# Patient Record
Sex: Female | Born: 1960 | Race: Black or African American | Hispanic: No | Smoking: Current some day smoker
Health system: Southern US, Community
[De-identification: ages and names within clinical notes are randomized; demographics above are authoritative.]

## PROBLEM LIST (undated history)

## (undated) DIAGNOSIS — E785 Hyperlipidemia, unspecified: Secondary | ICD-10-CM

## (undated) DIAGNOSIS — M1990 Unspecified osteoarthritis, unspecified site: Secondary | ICD-10-CM

## (undated) DIAGNOSIS — I10 Essential (primary) hypertension: Secondary | ICD-10-CM

## (undated) DIAGNOSIS — E119 Type 2 diabetes mellitus without complications: Secondary | ICD-10-CM

## (undated) HISTORY — DX: Hyperlipidemia, unspecified: E78.5

## (undated) HISTORY — DX: Unspecified osteoarthritis, unspecified site: M19.90

---

## 1996-06-23 HISTORY — PX: BACK SURGERY: SHX140

## 2000-05-18 ENCOUNTER — Emergency Department (HOSPITAL_COMMUNITY): Admission: EM | Admit: 2000-05-18 | Discharge: 2000-05-18 | Payer: Self-pay | Admitting: Emergency Medicine

## 2016-07-29 DIAGNOSIS — Z6835 Body mass index (BMI) 35.0-35.9, adult: Secondary | ICD-10-CM | POA: Diagnosis not present

## 2016-07-29 DIAGNOSIS — B029 Zoster without complications: Secondary | ICD-10-CM | POA: Diagnosis not present

## 2017-06-10 ENCOUNTER — Other Ambulatory Visit: Payer: Self-pay

## 2017-06-10 ENCOUNTER — Encounter (HOSPITAL_COMMUNITY): Payer: Self-pay | Admitting: Emergency Medicine

## 2017-06-10 ENCOUNTER — Emergency Department (HOSPITAL_COMMUNITY)
Admission: EM | Admit: 2017-06-10 | Discharge: 2017-06-10 | Disposition: A | Discharge status: Home / Self Care | Payer: 59 | Attending: Emergency Medicine | Admitting: Emergency Medicine

## 2017-06-10 DIAGNOSIS — I10 Essential (primary) hypertension: Secondary | ICD-10-CM | POA: Diagnosis not present

## 2017-06-10 DIAGNOSIS — R51 Headache: Secondary | ICD-10-CM | POA: Diagnosis present

## 2017-06-10 DIAGNOSIS — F17200 Nicotine dependence, unspecified, uncomplicated: Secondary | ICD-10-CM | POA: Diagnosis not present

## 2017-06-10 DIAGNOSIS — E119 Type 2 diabetes mellitus without complications: Secondary | ICD-10-CM | POA: Insufficient documentation

## 2017-06-10 DIAGNOSIS — R519 Headache, unspecified: Secondary | ICD-10-CM

## 2017-06-10 HISTORY — DX: Essential (primary) hypertension: I10

## 2017-06-10 HISTORY — DX: Type 2 diabetes mellitus without complications: E11.9

## 2017-06-10 MED ORDER — HYDROCHLOROTHIAZIDE 25 MG PO TABS: 25.0000 mg | ORAL_TABLET | Freq: Every day | ORAL | 0 refills | Status: DC

## 2017-06-10 MED ORDER — ATENOLOL 50 MG PO TABS: 50.0000 mg | ORAL_TABLET | Freq: Every day | ORAL | 0 refills | Status: DC

## 2017-06-10 NOTE — ED Triage Notes (Signed)
Pt reports being out of BP meds at home, c/o frequent headaches.  Pt reports [facility] 190[facility]120 at [facility]. Pt denies weakness, dizziness, changes to vision.  AOx4, needs meds refilled.

## 2017-06-10 NOTE — Discharge Instructions (Signed)
Please read attached information. If you experience any new or worsening signs or symptoms please return to the emergency room for evaluation. Please follow-up with your primary care provider or specialist as discussed. Please use medication prescribed only as directed and discontinue taking if you have any concerning signs or symptoms.   °

## 2017-06-10 NOTE — ED Provider Notes (Signed)
[place] EMERGENCY DEPARTMENT Provider Note   CSN: [id number] Arrival date & time: 06/10/17  1059     History   Chief Complaint Chief Complaint  Patient presents with  . Headache    HPI Tara Jarvis is a 56 y.o. female.  HPI    56 year old female presents today with complaints of headache.  Patient reports minor anterior based headache, denies any neurological deficits.  Patient notes these have been more frequent over the last month.  She reports that she has very minimal discomfort at the time of my evaluation.  Patient also reports that she checked her blood pressure yesterday and had an elevation to 190/120.  She reports she has been out of her blood pressure medication over the last month and took her brothers.  She notes she takes atenolol 50 mg twice daily, HCTZ once daily.  Patient denies any neurological deficits, chest pain shortness of breath abdominal pain, or any signs of endorgan damage.  She recently moved to the area and has not establish care with the primary care provider   Past Medical History:  Diagnosis Date  . Diabetes mellitus without complication ([place])   . Hypertension     There are no active problems to display for this patient.   Past Surgical History:  Procedure Laterality Date  . BACK SURGERY      OB History    No data available       Home Medications    Prior to Admission medications   Medication Sig Start Date End Date Taking? Authorizing Provider  latanoprost (XALATAN) 0.005 % ophthalmic solution Place 1 drop into both eyes at bedtime. 05/21/17  Yes [provider]  naproxen sodium (ALEVE) 220 MG tablet Take 220 mg by mouth 2 (two) times daily as needed (pain).   Yes [provider]  atenolol (TENORMIN) 50 MG tablet Take 1 tablet (50 mg total) by mouth daily. 06/10/17   Leniyah Martell, Dellis Filbert, PA-C  hydrochlorothiazide (HYDRODIURIL) 25 MG tablet Take 1 tablet (25 mg total) by mouth daily. 06/10/17    Okey Regal, PA-C    Family History No family history on file.  Social History Social History   Tobacco Use  . Smoking status: Current Every Day Smoker    Packs/day: 0.20  . Smokeless tobacco: Never Used  Substance Use Topics  . Alcohol use: Yes    Alcohol/week: 2.4 oz    Types: 4 Shots of liquor per week  . Drug use: No     Allergies   Patient has no known allergies.   Review of Systems Review of Systems  All other systems reviewed and are negative.    Physical Exam Updated Vital Signs BP (!) 160/90   Pulse 76   Temp 98.7 F (37.1 C) (Oral)   Resp 18   SpO2 100%   Physical Exam  Constitutional: She is oriented to person, place, and time. She appears well-developed and well-nourished.  HENT:  Head: Normocephalic and atraumatic.  Eyes: Conjunctivae are normal. Pupils are equal, round, and reactive to light. Right eye exhibits no discharge. Left eye exhibits no discharge. No scleral icterus.  Neck: Normal range of motion. No JVD present. No tracheal deviation present.  Cardiovascular: Normal rate, regular rhythm and normal heart sounds.  Pulmonary/Chest: Effort normal. No stridor.  Neurological: She is alert and oriented to person, place, and time. No cranial nerve deficit or sensory deficit. She exhibits normal muscle tone. Coordination normal.  Psychiatric: She has a normal mood  and affect. Her behavior is normal. Judgment and thought content normal.  Nursing note and vitals reviewed.    ED Treatments / Results  Labs (all labs ordered are listed, but only abnormal results are displayed) Labs Reviewed - No data to display  EKG  EKG Interpretation None       Radiology No results found.  Procedures Procedures (including critical care time)  Medications Ordered in ED Medications - No data to display   Initial Impression / Assessment and Plan / ED Course  I have reviewed the triage vital signs and the nursing notes.  Pertinent labs &  imaging results that were available during my care of the patient were reviewed by me and considered in my medical decision making (see chart for details).    Final Clinical Impressions(s) / ED Diagnoses   Final diagnoses:  Hypertension, unspecified type  Nonintractable headache, unspecified chronicity pattern, unspecified headache type    Labs:   Imaging:  Consults:  Therapeutics:  Discharge Meds: Atenolol, HCTZ  Assessment/Plan: 56 year old female presents today with asymptomatic hypertension.  Patient is having a mild headache, low suspicion this is truly caused by her headaches.  No signs of endorgan damage here today.  Patient will be given prescription for her hypertensive medication encouraged follow-up with primary care return immediately if any new or worsening signs or symptoms present.  Patient verbalized understanding and agreement to today's plan had no further questions or concerns   ED Discharge Orders        Ordered    atenolol (TENORMIN) 50 MG tablet  Daily     06/10/17 1331    hydrochlorothiazide (HYDRODIURIL) 25 MG tablet  Daily     06/10/17 1331       Okey Regal, PA-C 06/10/17 1851    Daleen Bo, MD 06/11/17 [phone]

## 2017-08-25 NOTE — Progress Notes (Signed)
Subjective   Patient ID: Tara Jarvis    DOB: 02/12/1961, 57 y.o. female   MRN: [id number]  CC: "Establish care"  HPI: Tara Jarvis is a 57 y.o. female who presents to clinic today for the following:  Hypertension: Patient was diagnosed with high blood pressure several years ago and started on HCTZ and atenolol.  She has been off her medication for the last month and a half.  She is a current everyday smoker.  She was previously tried on lisinopril but had an anaphylactic reaction.  Diabetes: Patient denies history of insulin use.  She has been on metformin for the last 2 years with good adherence and no side effects.  She does not check her sugar levels.  She is not sure what her last A1c was.  She was previously seen by an ophthalmologist annually.  Smoking: Current everyday smoker with 1/4 packs/day.  Has been smoking for 6 years without prior attempts to quit.  She is not currently interested in cessation today.  She denies cough, hemoptysis, explained weight loss, chest pain, shortness of breath.  Alcohol use: Currently drinks 2-4 next drinks on Friday or Saturday during the week.  She does not endorse intoxication.  She has considered cutting back and does not feel angry of guilty about her drinking.  Patient does feel sometimes she needs to have a drink when she gets angry.  ROS: see HPI for pertinent.  [place]: NIDT2DM, HTN, tobacco use disorder. Smoking status reviewed. Medications reviewed.  Objective   BP (!) 152/90   Pulse 90   Temp 98.1 F (36.7 C) (Oral)   Ht 5' 7"  (1.702 m)   Wt 216 lb (98 kg)   SpO2 99%   BMI 33.83 kg/m  Vitals and nursing note reviewed.  General: well nourished, well developed, NAD with non-toxic appearance HEENT: normocephalic, atraumatic, moist mucous membranes Neck: supple, non-tender without lymphadenopathy Cardiovascular: regular rate and rhythm without murmurs, rubs, or gallops Lungs: clear to auscultation bilaterally with normal work  of breathing Abdomen: soft, non-tender, non-distended, normoactive bowel sounds Skin: warm, dry, no rashes or lesions, cap refill < 2 seconds Extremities: warm and well perfused, normal tone, no edema  Assessment & Plan   Tobacco use disorder Chronic.  Current everyday user.  Smoking 1/4 packs/day with 6-year history.  No prior attempts to stop.  Not interested at present. - Discussed importance of smoking cessation  Primary hypertension Chronic.  Poorly controlled due to expiration of antihypertensive.  Currently on previously on HCTZ and atenolol.  Does have anaphylaxis to ACE inhibitors. - Given refill for HCTZ 25 mg daily, will plan to restart atenolol if BP continues to be elevated at follow-up - Advised patient to check ambulatory BP at local pharmacy and/or grocery store - Discussed smoking cessation  Controlled diabetes mellitus with complication, without long-term current use of insulin (HCC) Chronic.  Currently controlled.  A1c 7.0.  No history of insulin use. - Ambulatory referral to ophthalmology for annual screening - CBC with differential, CMET, TSH, lipid panel, microalbumin/creatinine urine ratio - Switched to metformin extended release 500 mg daily  Orders Placed This Encounter  Procedures  . CBC with Differential/Platelet  . CMP14+EGFR  . TSH  . Lipid panel  . Microalbumin / creatinine urine ratio  . Ambulatory referral to Ophthalmology    Referral Priority:   Routine    Referral Type:   Consultation    Referral Reason:   Specialty Services Required    Requested Specialty:  Ophthalmology    Number of Visits Requested:   1  . HgB A1c   Meds ordered this encounter  Medications  . metFORMIN (GLUCOPHAGE XR) 500 MG 24 hr tablet    Sig: Take 1 tablet (500 mg total) by mouth daily with breakfast.    Dispense:  90 tablet    Refill:  3  . DISCONTD: hydrochlorothiazide (HYDRODIURIL) 25 MG tablet    Sig: Take 1 tablet (25 mg total) by mouth daily.    Dispense:   90 tablet    Refill:  3  . hydrochlorothiazide (HYDRODIURIL) 25 MG tablet    Sig: Take 1 tablet (25 mg total) by mouth daily.    Dispense:  90 tablet    Refill:  [place], [place], PGY-2 08/28/2017, 2:19 PM

## 2017-08-28 ENCOUNTER — Encounter: Payer: Self-pay | Admitting: Family Medicine

## 2017-08-28 ENCOUNTER — Other Ambulatory Visit: Payer: Self-pay

## 2017-08-28 ENCOUNTER — Ambulatory Visit (INDEPENDENT_AMBULATORY_CARE_PROVIDER_SITE_OTHER): Payer: Medicare Other | Admitting: Family Medicine

## 2017-08-28 VITALS — BP 152/90 | HR 90 | Temp 98.1°F | Ht 67.0 in | Wt 216.0 lb

## 2017-08-28 DIAGNOSIS — F17200 Nicotine dependence, unspecified, uncomplicated: Secondary | ICD-10-CM | POA: Diagnosis not present

## 2017-08-28 DIAGNOSIS — E118 Type 2 diabetes mellitus with unspecified complications: Secondary | ICD-10-CM

## 2017-08-28 DIAGNOSIS — E1165 Type 2 diabetes mellitus with hyperglycemia: Secondary | ICD-10-CM | POA: Insufficient documentation

## 2017-08-28 DIAGNOSIS — I10 Essential (primary) hypertension: Secondary | ICD-10-CM

## 2017-08-28 DIAGNOSIS — R7309 Other abnormal glucose: Secondary | ICD-10-CM

## 2017-08-28 DIAGNOSIS — Z0000 Encounter for general adult medical examination without abnormal findings: Secondary | ICD-10-CM

## 2017-08-28 DIAGNOSIS — E119 Type 2 diabetes mellitus without complications: Secondary | ICD-10-CM | POA: Insufficient documentation

## 2017-08-28 DIAGNOSIS — IMO0002 Reserved for concepts with insufficient information to code with codable children: Secondary | ICD-10-CM | POA: Insufficient documentation

## 2017-08-28 LAB — POCT GLYCOSYLATED HEMOGLOBIN (HGB A1C): HEMOGLOBIN A1C: 7

## 2017-08-28 MED ORDER — HYDROCHLOROTHIAZIDE 25 MG PO TABS: 25.0000 mg | ORAL_TABLET | Freq: Every day | ORAL | 3 refills | Status: DC

## 2017-08-28 MED ORDER — METFORMIN HCL ER 500 MG PO TB24: 500.0000 mg | ORAL_TABLET | Freq: Every day | ORAL | 3 refills | Status: DC

## 2017-08-28 NOTE — Assessment & Plan Note (Addendum)
Chronic.  Poorly controlled due to expiration of antihypertensive.  Currently on previously on HCTZ and atenolol.  Does have anaphylaxis to ACE inhibitors. - Given refill for HCTZ 25 mg daily, will plan to restart atenolol if BP continues to be elevated at follow-up - Advised patient to check ambulatory BP at local pharmacy and/or grocery store - Discussed smoking cessation

## 2017-08-28 NOTE — Patient Instructions (Signed)
Thank you for coming in to see [place] today. Please see below to review our plan for today's visit.  1.  I will call you if your labs are abnormal, otherwise expect to receive the results in the mail. 2.  I sent in a refill of your HCTZ for your blood pressure and a refill of metformin extended release which you will take daily. 3.  We will see you again in 1 month.  Please call the clinic at [phone] if your symptoms worsen or you have any concerns. It was our pleasure to serve you.  Harriet Butte, [place], PGY-2

## 2017-08-28 NOTE — Assessment & Plan Note (Addendum)
Chronic.  Currently controlled.  A1c 7.0.  No history of insulin use. - Ambulatory referral to ophthalmology for annual screening - CBC with differential, CMET, TSH, lipid panel, microalbumin/creatinine urine ratio - Switched to metformin extended release 500 mg daily

## 2017-08-28 NOTE — Assessment & Plan Note (Addendum)
Chronic.  Current everyday user.  Smoking 1/4 packs/day with 6-year history.  No prior attempts to stop.  Not interested at present. - Discussed importance of smoking cessation

## 2017-08-29 LAB — CBC WITH DIFFERENTIAL/PLATELET
Basophils Absolute: 0 10*3/uL (ref 0.0–0.2)
Basos: 0 %
EOS (ABSOLUTE): 0.1 10*3/uL (ref 0.0–0.4)
EOS: 1 %
Hematocrit: 43 % (ref 34.0–46.6)
Hemoglobin: 14.3 g/dL (ref 11.1–15.9)
Immature Grans (Abs): 0 10*3/uL (ref 0.0–0.1)
Immature Granulocytes: 0 %
LYMPHS ABS: 1.8 10*3/uL (ref 0.7–3.1)
Lymphs: 39 %
MCH: 29.9 pg (ref 26.6–33.0)
MCHC: 33.3 g/dL (ref 31.5–35.7)
MCV: 90 fL (ref 79–97)
Monocytes Absolute: 0.3 10*3/uL (ref 0.1–0.9)
Monocytes: 7 %
NEUTROS ABS: 2.5 10*3/uL (ref 1.4–7.0)
Neutrophils: 53 %
Platelets: 209 10*3/uL (ref 150–379)
RBC: 4.79 x10E6/uL (ref 3.77–5.28)
RDW: 16.3 % — ABNORMAL HIGH (ref 12.3–15.4)
WBC: 4.7 10*3/uL (ref 3.4–10.8)

## 2017-08-29 LAB — MICROALBUMIN / CREATININE URINE RATIO
CREATININE, UR: 243.8 mg/dL
MICROALBUM., U, RANDOM: 45.1 ug/mL
Microalb/Creat Ratio: 18.5 mg/g creat (ref 0.0–30.0)

## 2017-08-29 LAB — CMP14+EGFR
ALK PHOS: 60 IU/L (ref 39–117)
ALT: 9 IU/L (ref 0–32)
AST: 13 IU/L (ref 0–40)
Albumin/Globulin Ratio: 1.4 (ref 1.2–2.2)
Albumin: 4.6 g/dL (ref 3.5–5.5)
BUN/Creatinine Ratio: 13 (ref 9–23)
BUN: 13 mg/dL (ref 6–24)
Bilirubin Total: 0.3 mg/dL (ref 0.0–1.2)
CALCIUM: 9.9 mg/dL (ref 8.7–10.2)
CO2: 26 mmol/L (ref 20–29)
CREATININE: 0.98 mg/dL (ref 0.57–1.00)
Chloride: 102 mmol/L (ref 96–106)
GFR, EST AFRICAN AMERICAN: 75 mL/min/{1.73_m2} (ref >59)
GFR, EST NON AFRICAN AMERICAN: 65 mL/min/{1.73_m2} (ref >59)
GLOBULIN, TOTAL: 3.2 g/dL (ref 1.5–4.5)
Glucose: 127 mg/dL — ABNORMAL HIGH (ref 65–99)
Potassium: 3.8 mmol/L (ref 3.5–5.2)
SODIUM: 145 mmol/L — AB (ref 134–144)
Total Protein: 7.8 g/dL (ref 6.0–8.5)

## 2017-08-29 LAB — LIPID PANEL
CHOL/HDL RATIO: 3.2 ratio (ref 0.0–4.4)
Cholesterol, Total: 249 mg/dL — ABNORMAL HIGH (ref 100–199)
HDL: 77 mg/dL (ref >39)
LDL CALC: 153 mg/dL — AB (ref 0–99)
Triglycerides: 94 mg/dL (ref 0–149)
VLDL CHOLESTEROL CAL: 19 mg/dL (ref 5–40)

## 2017-08-29 LAB — TSH: TSH: 0.827 u[IU]/mL (ref 0.450–4.500)

## 2017-09-01 ENCOUNTER — Encounter: Payer: Self-pay | Admitting: Family Medicine

## 2017-10-07 ENCOUNTER — Ambulatory Visit (INDEPENDENT_AMBULATORY_CARE_PROVIDER_SITE_OTHER): Payer: Medicare Other | Admitting: Family Medicine

## 2017-10-07 ENCOUNTER — Encounter: Payer: Self-pay | Admitting: Family Medicine

## 2017-10-07 VITALS — BP 146/80 | HR 89 | Temp 98.7°F | Wt 218.2 lb

## 2017-10-07 DIAGNOSIS — F17200 Nicotine dependence, unspecified, uncomplicated: Secondary | ICD-10-CM

## 2017-10-07 DIAGNOSIS — I10 Essential (primary) hypertension: Secondary | ICD-10-CM

## 2017-10-07 DIAGNOSIS — Z114 Encounter for screening for human immunodeficiency virus [HIV]: Secondary | ICD-10-CM | POA: Diagnosis not present

## 2017-10-07 DIAGNOSIS — E118 Type 2 diabetes mellitus with unspecified complications: Secondary | ICD-10-CM

## 2017-10-07 DIAGNOSIS — Z23 Encounter for immunization: Secondary | ICD-10-CM

## 2017-10-07 DIAGNOSIS — Z1159 Encounter for screening for other viral diseases: Secondary | ICD-10-CM | POA: Diagnosis not present

## 2017-10-07 NOTE — Assessment & Plan Note (Signed)
Chronic.  Controlled with A1c 7.0 as of March.  Currently taking metformin extended release.  Does not exercise. - Continue metformin extended release 500 mg daily - Administered PPNV-23 - Diabetic foot exam performed - Next A1c due 11/28/2017 - RTC in 2 months

## 2017-10-07 NOTE — Assessment & Plan Note (Signed)
Chronic.  Patient is not a good candidate for cessation today. - Encourage patient to notify me when she is ready for cessation

## 2017-10-07 NOTE — Assessment & Plan Note (Signed)
Chronic.  Controlled on HCTZ despite discontinuation of atenolol since visit 1 month ago.  Current everyday smoker. - Continue HCTZ 25 mg daily - Consider initiating amlodipine in the future if BPs are not controlled - Discussed smoking cessation

## 2017-10-07 NOTE — Patient Instructions (Signed)
Thank you for coming in to see [place] today. Please see below to review our plan for today's visit.  1.  You are now up-to-date on her vaccinations. 2.  Continue taking the HCTZ for blood pressure control.  It is important that you stop smoking as this improves blood pressure and will prevent complications such as lung cancer.  Let me know at any point if you are interested in cessation.  In the meantime you can call 1-800-QUIT-NOW for free resources. 3.  We will work on getting in touch with the [facility] women's clinic to get your Pap and mammogram results. 4.  Call one of the GI groups in the area based on the paperwork I gave you and schedule a screening colonoscopy.  Please call the clinic at [phone] if your symptoms worsen or you have any concerns. It was our pleasure to serve you.  Harriet Butte, [place], PGY-2

## 2017-10-07 NOTE — Progress Notes (Signed)
   Subjective   Patient ID: Tara Jarvis    DOB: 06/26/1960, 57 y.o. female   MRN: [id number]  CC: " Hypertension"  HPI: Tara Jarvis is a 57 y.o. female who presents to clinic today for the following:  Hypertension: Patient continues to take HCTZ but discontinued atenolol as instructed during visit in March.  She has not checked her blood pressure.  She continues to smoke daily but is not interested in cessation.  Diabetes: Currently on metformin without insulin use.  States she has not seen an ophthalmologist for her diabetic eye exam.  She is pleased that her metformin is now once daily.  Smoking: Current everyday smoker with 1/4 packs/day for the past 6 years without any prior attempts to quit.  She is not interested in cessation today.  She denies cough, hemoptysis, Weight loss, chest pain or shortness of breath.  Screening for HCV: Patient denies history of risky sexual behavior, IV drug use, or blood transfusions.  She is interested in being screened today.  Screening for HIV: Patient denies history of risky sexual behavior, IV drug use.  She is interested in being screened today.  ROS: see HPI for pertinent.  [place]: NIDT2DM, HTN, tobacco use disorder. Smoking status reviewed. Medications reviewed.  Objective   BP (!) 146/80 (BP Location: Right Arm, Patient Position: Sitting, Cuff Size: Normal)   Pulse 89   Temp 98.7 F (37.1 C) (Oral)   Wt 218 lb 3.2 oz (99 kg)   SpO2 100%   BMI 34.17 kg/m  Vitals and nursing note reviewed.  General: well nourished, well developed, NAD with non-toxic appearance HEENT: normocephalic, atraumatic, moist mucous membranes Neck: supple, non-tender without lymphadenopathy Cardiovascular: regular rate and rhythm without murmurs, rubs, or gallops Lungs: clear to auscultation bilaterally with normal work of breathing Skin: warm, dry, no rashes or lesions, cap refill < 2 seconds Extremities: warm and well perfused, normal tone, no edema, no  callus or decreased sensation on feet bilaterally with 2+ dorsal pedal pulse  Assessment & Plan   Controlled diabetes mellitus with complication, without long-term current use of insulin (HCC) Chronic.  Controlled with A1c 7.0 as of March.  Currently taking metformin extended release.  Does not exercise. - Continue metformin extended release 500 mg daily - Administered PPNV-23 - Diabetic foot exam performed - Next A1c due 11/28/2017 - RTC in 2 months  Tobacco use disorder Chronic.  Patient is not a good candidate for cessation today. - Encourage patient to notify me when she is ready for cessation  Primary hypertension Chronic.  Controlled on HCTZ despite discontinuation of atenolol since visit 1 month ago.  Current everyday smoker. - Continue HCTZ 25 mg daily - Consider initiating amlodipine in the future if BPs are not controlled - Discussed smoking cessation  Orders Placed This Encounter  Procedures  . Tdap vaccine greater than or equal to 7yo IM  . Pneumococcal polysaccharide vaccine 23-valent greater than or equal to 2yo subcutaneous/IM  . HIV antibody (with reflex)  . Hepatitis C antibody   No orders of the defined types were placed in this encounter.   Harriet Butte, [place], PGY-2 10/07/2017, 5:45 PM

## 2017-10-08 ENCOUNTER — Encounter: Payer: Self-pay | Admitting: Family Medicine

## 2017-10-08 DIAGNOSIS — E119 Type 2 diabetes mellitus without complications: Secondary | ICD-10-CM | POA: Diagnosis not present

## 2017-10-08 DIAGNOSIS — Z7984 Long term (current) use of oral hypoglycemic drugs: Secondary | ICD-10-CM | POA: Diagnosis not present

## 2017-10-08 DIAGNOSIS — H524 Presbyopia: Secondary | ICD-10-CM | POA: Diagnosis not present

## 2017-10-08 DIAGNOSIS — H401132 Primary open-angle glaucoma, bilateral, moderate stage: Secondary | ICD-10-CM | POA: Diagnosis not present

## 2017-10-08 LAB — HEPATITIS C ANTIBODY: Hep C Virus Ab: <0.1 s/co ratio (ref 0.0–0.9)

## 2017-10-08 LAB — HIV ANTIBODY (ROUTINE TESTING W REFLEX): HIV SCREEN 4TH GENERATION: NONREACTIVE

## 2017-10-20 ENCOUNTER — Encounter: Payer: Self-pay | Admitting: Family Medicine

## 2017-10-20 DIAGNOSIS — H409 Unspecified glaucoma: Secondary | ICD-10-CM | POA: Insufficient documentation

## 2017-11-12 ENCOUNTER — Encounter: Payer: Self-pay | Admitting: Family Medicine

## 2017-11-19 DIAGNOSIS — H401132 Primary open-angle glaucoma, bilateral, moderate stage: Secondary | ICD-10-CM | POA: Diagnosis not present

## 2018-01-02 ENCOUNTER — Ambulatory Visit (INDEPENDENT_AMBULATORY_CARE_PROVIDER_SITE_OTHER): Payer: Medicare Other | Admitting: Urgent Care

## 2018-01-02 ENCOUNTER — Encounter: Payer: Self-pay | Admitting: Urgent Care

## 2018-01-02 ENCOUNTER — Other Ambulatory Visit: Payer: Self-pay

## 2018-01-02 VITALS — BP 162/111 | HR 98 | Temp 98.9°F | Resp 16 | Ht 67.0 in | Wt 216.2 lb

## 2018-01-02 DIAGNOSIS — I10 Essential (primary) hypertension: Secondary | ICD-10-CM

## 2018-01-02 DIAGNOSIS — Z111 Encounter for screening for respiratory tuberculosis: Secondary | ICD-10-CM

## 2018-01-02 DIAGNOSIS — F17210 Nicotine dependence, cigarettes, uncomplicated: Secondary | ICD-10-CM

## 2018-01-02 DIAGNOSIS — R030 Elevated blood-pressure reading, without diagnosis of hypertension: Secondary | ICD-10-CM

## 2018-01-02 NOTE — Progress Notes (Signed)
  Tuberculosis Risk Questionnaire  1. No Were you born outside the [place] in one of the following parts of the world: [place], [place], [place], [place] America or Eastern [place]?    2. No Have you traveled outside the [place] and lived for more than one month in one of the following parts of the world: [place], [place], [place], [place] or [place]?    3. No Do you have a compromised immune system such as from any of the following conditions:HIV/AIDS, organ or bone marrow transplantation, diabetes, immunosuppressive medicines (e.g. Prednisone, Remicaide), leukemia, lymphoma, cancer of the head or neck, gastrectomy or jejunal bypass, end-stage renal disease (on dialysis), or silicosis?     4. Yes  Have you ever or do you plan on working in: a residential care center, a health care facility, a jail or prison or homeless shelter?    5. No Have you ever: injected illegal drugs, used crack cocaine, lived in a homeless shelter  or been in jail or prison?     6. No Have you ever been exposed to anyone with infectious tuberculosis?  7. No Have you ever had a BCG vaccine? (BCG is a vaccine for tuberculosis  (TB) used in OTHER countries, NOT in the [place]).  8. No Have you ever been advised by a health care provider NOT to have a TB skin test?  9. No Have you ever had a POSITIVE TB skin test?  IF SO, when? n/a  IF SO, were you treated with INH? n/a  IF SO, where? n/a  Tuberculosis Symptom Questionnaire  Do you currently have any of the following symptoms?  1. No Unexplained cough lasting more than 3 weeks?   2. No Unexplained fever lasting more than 3 weeks.   3. No Night Sweats (sweating that leaves the bedclothes and sheets wet)     4. No Shortness of Breath   5. No Chest Pain   6. No Unintentional weight loss    7. No Unexplained fatigue (very tired for no reason)    

## 2018-01-02 NOTE — Patient Instructions (Addendum)
Please make sure that you follow up with your PCP regarding your blood pressure.    Tuberculin purified protein derivative, PPD injection What is this medicine? TUBERCULIN PURIFIED PROTEIN DERIVATIVE, PPD is a test used to detect if you have a tuberculosis infection. It will not cause tuberculosis infection. This medicine may be used for other purposes; ask your health care provider or pharmacist if you have questions. COMMON BRAND NAME(S): Aplisol, Tubersol What should I tell my health care provider before I take this medicine? They need to know if you have any of these conditions: -diabetes -HIV or AIDS -immune system problems -infection (especially a virus infection such as chickenpox, cold sores, or herpes) -kidney disease -malnutrition -an unusual or allergic reaction to tuberculin purified protein derivative, PPD, other medicines, foods, dyes, or preservatives -pregnant or trying to get pregnant -breast-feeding How should I use this medicine? This medicine is for injection in the skin. It is given by a health care professional in a hospital or clinic setting. Talk to your pediatrician regarding the use of this medicine in children. While this drug may be prescribed in children, precautions do apply. Overdosage: If you think you have taken too much of this medicine contact a poison control center or emergency room at once. NOTE: This medicine is only for you. Do not share this medicine with others. What if I miss a dose? It is important not to miss your dose. Call your doctor or health care professional if you are unable to keep an appointment. What may interact with this medicine? -adalimumab -anakinra -etanercept -infliximab -live virus vaccines -medicines to treat cancer -steroid medicines like prednisone or cortisone This list may not describe all possible interactions. Give your health care provider a list of all the medicines, herbs, non-prescription drugs, or dietary  supplements you use. Also tell them if you smoke, drink alcohol, or use illegal drugs. Some items may interact with your medicine. What should I watch for while using this medicine? See your health care provider as directed. This medicine does not protect against tuberculosis. What side effects may I notice from receiving this medicine? Side effects that you should report to your doctor or health care professional as soon as possible: -allergic reactions like skin rash, itching or hives, swelling of the face, lips, or tongue -breathing problems Side effects that usually do not require medical attention (report to your doctor or health care professional if they continue or are bothersome): -bruising -pain, redness, or irritation at site where injected This list may not describe all possible side effects. Call your doctor for medical advice about side effects. You may report side effects to FDA at 1-800-FDA-1088. Where should I keep my medicine? This drug is given in a hospital or clinic and will not be stored at home. NOTE: This sheet is a summary. It may not cover all possible information. If you have questions about this medicine, talk to your doctor, pharmacist, or health care provider.  2018 Elsevier/Gold Standard (2015-07-12 11:42:34)     IF you received an x-ray today, you will receive an invoice from [facility]. Please contact [facility] at [phone] with questions or concerns regarding your invoice.   IF you received labwork today, you will receive an invoice from [facility]. Please contact [facility] at [phone] with questions or concerns regarding your invoice.   Our billing staff will not be able to assist you with questions regarding bills from these companies.  You will be contacted with the lab results as soon  as they are available. The fastest way to get your results is to activate your My Chart account. Instructions are located on the last page of this  paperwork. If you have not heard from [place] regarding the results in 2 weeks, please contact this office.

## 2018-01-02 NOTE — Progress Notes (Signed)
    MRN: [id number] DOB: Aug 15, 1960  Subjective:   Tara Jarvis is a 57 y.o. female presenting for follow up on Hypertension. Currently managed with HCTZ at 25mg , states she did not take it this morning. She is seen at the [facility] for her BP.  Denies dizziness, chronic headache, chest pain, shortness of breath, heart racing, palpitations, nausea, vomiting, abdominal pain, hematuria, lower leg swelling. Tara Jarvis  reports that she has been smoking.  She has been smoking about 0.20 packs per day. She has never used smokeless tobacco. She reports that she drinks about 2.4 oz of alcohol per week. She reports that she does not use drugs.   Tara Jarvis has a current medication list which includes the following prescription(s): brimonidine-timolol, hydrochlorothiazide, latanoprost, metformin, and naproxen sodium. Also is allergic to lisinopril.  Tara Jarvis  has a past medical history of Diabetes mellitus without complication ([place]) and Hypertension. Also  has a past surgical history that includes Back surgery (1998). Her family history includes Diabetes in her mother; Heart disease in her father; Hyperlipidemia in her mother; Hypertension in her brother, mother, and sister; Kidney disease in her mother.   Objective:   Vitals: BP (!) 162/111   Pulse 98   Temp 98.9 F (37.2 C)   Resp 16   Ht 5\' 7"  (1.702 m)   Wt 216 lb 3.2 oz (98.1 kg)   SpO2 100%   BMI 33.86 kg/m   BP Readings from Last 3 Encounters:  01/02/18 (!) 162/111  10/07/17 (!) 146/80  08/28/17 (!) 152/90    Physical Exam  Constitutional: She is oriented to person, place, and time. She appears well-developed and well-nourished.  HENT:  Mouth/Throat: Oropharynx is clear and moist.  Eyes: Pupils are equal, round, and reactive to light. EOM are normal. Right eye exhibits no discharge. Left eye exhibits no discharge. No scleral icterus.  Neck: Normal range of motion. Neck supple. No thyromegaly present.  Cardiovascular: Normal rate, regular  rhythm, normal heart sounds and intact distal pulses. Exam reveals no gallop and no friction rub.  No murmur heard. Pulmonary/Chest: Effort normal and breath sounds normal. No stridor. No respiratory distress. She has no wheezes. She has no rales.  Musculoskeletal: She exhibits no edema.  Neurological: She is alert and oriented to person, place, and time.  Skin: Skin is warm and dry.  Psychiatric: She has a normal mood and affect.   Assessment and Plan :   Screening for tuberculosis - Plan: TB Skin Test  Essential hypertension  Elevated blood pressure reading  [facility] does not do Quantiferon test on the weekends. We will perform PPD test. Counseled patient on risks of uncontrolled HTN. She is to follow up with PCP this upcoming week or come back to see [place] if she cannot get in with them. Return-to-clinic precautions discussed, patient verbalized understanding.   Jaynee Eagles, PA-C [facility] [phone] 01/02/2018  9:58 AM

## 2018-01-04 ENCOUNTER — Ambulatory Visit: Payer: Medicare Other | Admitting: Physician Assistant

## 2018-01-04 DIAGNOSIS — Z111 Encounter for screening for respiratory tuberculosis: Secondary | ICD-10-CM

## 2018-01-04 LAB — TB SKIN TEST
Induration: 0 mm
TB Skin Test: NEGATIVE

## 2018-01-05 NOTE — Progress Notes (Signed)
Nurse visit only

## 2018-05-24 ENCOUNTER — Ambulatory Visit: Payer: Medicare Other | Admitting: Family Medicine

## 2018-05-24 NOTE — Progress Notes (Deleted)
   Subjective   Patient ID: Keani Gotcher    DOB: 01-Jun-1961, 57 y.o. female   MRN: [id number]  CC: "***"  HPI: Shelitha Magley is a 57 y.o. female who presents to clinic today for the following:  ***: ***  ***Seen by me in April for routine check.  Diabetes well controlled with A1c 7.0.  Will need recheck today.  Did not follow-up in 2 months as instructed.  Did not appear to be interested in smoking cessation.  Blood pressure controlled on HCTZ.  Health maintenance includes Pap smear, colonoscopy, mammogram.  Needs flu.  ROS: see HPI for pertinent.  [place]: NIDDM,HTN, tobacco use disorder. Smoking status reviewed. Medications reviewed.  Objective   There were no vitals taken for this visit. Vitals and nursing note reviewed.  General: well nourished, well developed, NAD with non-toxic appearance HEENT: normocephalic, atraumatic, moist mucous membranes Neck: supple, non-tender without lymphadenopathy Cardiovascular: regular rate and rhythm without murmurs, rubs, or gallops Lungs: clear to auscultation bilaterally with normal work of breathing Abdomen: soft, non-tender, non-distended, normoactive bowel sounds Skin: warm, dry, no rashes or lesions, cap refill < 2 seconds Extremities: warm and well perfused, normal tone, no edema  Assessment & Plan   No problem-specific Assessment & Plan notes found for this encounter.  No orders of the defined types were placed in this encounter.  No orders of the defined types were placed in this encounter.   Harriet Butte, [place], PGY-3 05/24/2018, 8:52 AM

## 2018-06-28 ENCOUNTER — Emergency Department (HOSPITAL_COMMUNITY)
Admission: EM | Admit: 2018-06-28 | Discharge: 2018-06-29 | Disposition: A | Discharge status: Home / Self Care | Payer: 59 | Attending: Emergency Medicine | Admitting: Emergency Medicine

## 2018-06-28 ENCOUNTER — Encounter (HOSPITAL_COMMUNITY): Payer: Self-pay

## 2018-06-28 ENCOUNTER — Other Ambulatory Visit: Payer: Self-pay

## 2018-06-28 DIAGNOSIS — Z5321 Procedure and treatment not carried out due to patient leaving prior to being seen by health care provider: Secondary | ICD-10-CM | POA: Diagnosis not present

## 2018-06-28 DIAGNOSIS — M545 Low back pain: Secondary | ICD-10-CM | POA: Insufficient documentation

## 2018-06-28 NOTE — ED Triage Notes (Signed)
Pt arrives POV for eval of L lower back pain w/radition to L posterior leg. Pt reports she fell 2-3 days ago due to L leg giving out from pain. Pt denies head injury at time of fall. States back pain has worsened since the time of fall. GCS 15, no focal neuro deficits in triage.

## 2018-06-29 NOTE — ED Notes (Signed)
Pt seen walking out of room and leaving the emergency department.

## 2018-08-18 ENCOUNTER — Ambulatory Visit (INDEPENDENT_AMBULATORY_CARE_PROVIDER_SITE_OTHER): Payer: 59 | Admitting: Family Medicine

## 2018-08-18 VITALS — BP 145/80 | HR 82 | Temp 98.4°F | Wt 233.2 lb

## 2018-08-18 DIAGNOSIS — F17200 Nicotine dependence, unspecified, uncomplicated: Secondary | ICD-10-CM

## 2018-08-18 DIAGNOSIS — E1165 Type 2 diabetes mellitus with hyperglycemia: Secondary | ICD-10-CM

## 2018-08-18 DIAGNOSIS — E669 Obesity, unspecified: Secondary | ICD-10-CM | POA: Diagnosis not present

## 2018-08-18 DIAGNOSIS — I10 Essential (primary) hypertension: Secondary | ICD-10-CM

## 2018-08-18 DIAGNOSIS — IMO0001 Reserved for inherently not codable concepts without codable children: Secondary | ICD-10-CM

## 2018-08-18 LAB — POCT GLYCOSYLATED HEMOGLOBIN (HGB A1C): HBA1C, POC (CONTROLLED DIABETIC RANGE): 7.7 % — AB (ref 0.0–7.0)

## 2018-08-18 MED ORDER — HYDROCHLOROTHIAZIDE 25 MG PO TABS: 25.0000 mg | ORAL_TABLET | Freq: Every day | ORAL | 3 refills | Status: DC

## 2018-08-18 NOTE — Assessment & Plan Note (Signed)
Current everyday user.  Interested in cessation, but not yet ready. - Discussed importance of cessation and set goal to decrease 1cigarette/day every month - RTC 1 month

## 2018-08-18 NOTE — Patient Instructions (Signed)
Thank you for coming in to see [place] today. Please see below to review our plan for today's visit.  1.  Your diabetes is poorly controlled.  We will stop the metformin since you are having diarrhea associated with the medication.  You may need to start additional medication in the future if your A1c continues to be elevated.  The first thing you need to do is cut out unnecessary sugars in your diet such as sugary beverages.  Limit yourself to 0-calorie 0 sugar drinks during the week.  This means avoiding sweet tea, soft drinks, and Kool-Aid.  We will see you in 1 month at which point we will make our next step towards eating healthier choices. 2.  You may need to start cholesterol medication in the future.  Working towards your goals as mentioned above will be the first step to help preventing you from having high cholesterol. 3.  Let me know when you are ready to quit smoking.  For now we will gradually wean 1 cigarette/month.  Please call the clinic at [phone] if your symptoms worsen or you have any concerns. It was our pleasure to serve you.  Harriet Butte, [place], PGY-3

## 2018-08-18 NOTE — Progress Notes (Signed)
   Subjective   Patient ID: Tara Jarvis    DOB: 1961/02/23, 58 y.o. female   MRN: [id number]  CC: "Check-up"  HPI: Tara Jarvis is a 58 y.o. female who presents to clinic today for the following:  Diabetes: Ar[place] has a history of diabetes adequately controlled with metformin.  Unfortunately, she reports only taking medication 4-5 times per week.  She does endorse some episodes of diarrhea associated with the metformin even with the extended release.  She is not watching her sugar intake.  She was supposed to follow-up in 2 months following her last appointment back in April but is here today because she needs a refill on her medications.  Obesity: Has a sedentary lifestyle.  Does not exercise.  Does not restrict carbohydrate intake reports good control.  Currently enjoys multiple sugary beverages including Kool-Aid, sodas, and sweet tea.  Hypertension: Forgot to take her HCTZ earlier today.  Usually misses her dose once per week.  Currently smokes.  Tobacco use disorder: Current everyday smoker with 3 cigarettes/day.  Has been smoking since age 35 with as much is 1/2 pack/day.  Interested in cessation but not yet ready to quit altogether.  Would like to consider gradually weaning the number of cigarettes over the next several months.  Denies symptoms of shortness of breath, chest pain, cough, unexplained weight loss.  ROS: see HPI for pertinent.  [place]: NIDDM,HTN, tobacco use disorder. Smoking status reviewed. Medications reviewed.  Objective   BP (!) 145/80   Pulse 82   Temp 98.4 F (36.9 C)   Wt 233 lb 3.2 oz (105.8 kg)   SpO2 100%   BMI 36.52 kg/m  Vitals and nursing note reviewed.  General: well nourished, well developed, NAD with non-toxic appearance HEENT: normocephalic, atraumatic, moist mucous membranes Neck: supple, non-tender without lymphadenopathy Cardiovascular: regular rate and rhythm without murmurs, rubs, or gallops Lungs: clear to auscultation bilaterally  with normal work of breathing Abdomen: obese, soft, non-tender, non-distended, normoactive bowel sounds Skin: warm, dry, no rashes or lesions, cap refill < 2 seconds Extremities: warm and well perfused, normal tone, no edema  Assessment & Plan   Tobacco use disorder Current everyday user.  Interested in cessation, but not yet ready. - Discussed importance of cessation and set goal to decrease 1cigarette/day every month - RTC 1 month  Controlled diabetes mellitus with complication, without long-term current use of insulin (HCC) Chronic.  Now uncontrolled, A1c 7.7.  Poor adherence to metformin and sedentary lifestyle without carbohydrate restriction.  Not adherence to healthy lifestyle modifications is likely driving her worsening glucose levels. - Set goals to avoid sugary beverages and discussed other means of carbohydrates which should be restricted - Follow-up in 1 month to discuss the next step towards healthy lifestyle and discussed importance of exercise - Discontinuing metformin XR 500 mg daily due to associated diarrhea and discussed possibly needing injectables if lifestyle modification does not make enough of a difference  Primary hypertension Chronic.  Uncontrolled today due to nonadherence with antihypertensives.  Continues to smoke. - See plan for smoking cessation - Given refill for HCTZ 25 mg daily  Orders Placed This Encounter  Procedures  . HgB A1c   Meds ordered this encounter  Medications  . hydrochlorothiazide (HYDRODIURIL) 25 MG tablet    Sig: Take 1 tablet (25 mg total) by mouth daily.    Dispense:  90 tablet    Refill:  [place], [place], PGY-3 08/18/2018, 4:11 PM

## 2018-08-18 NOTE — Assessment & Plan Note (Signed)
Chronic.  Now uncontrolled, A1c 7.7.  Poor adherence to metformin and sedentary lifestyle without carbohydrate restriction.  Not adherence to healthy lifestyle modifications is likely driving her worsening glucose levels. - Set goals to avoid sugary beverages and discussed other means of carbohydrates which should be restricted - Follow-up in 1 month to discuss the next step towards healthy lifestyle and discussed importance of exercise - Discontinuing metformin XR 500 mg daily due to associated diarrhea and discussed possibly needing injectables if lifestyle modification does not make enough of a difference

## 2018-08-18 NOTE — Assessment & Plan Note (Addendum)
Chronic.  Uncontrolled today due to nonadherence with antihypertensives.  Continues to smoke. - See plan for smoking cessation - Given refill for HCTZ 25 mg daily

## 2018-09-01 ENCOUNTER — Other Ambulatory Visit: Payer: Self-pay

## 2018-09-01 ENCOUNTER — Ambulatory Visit (INDEPENDENT_AMBULATORY_CARE_PROVIDER_SITE_OTHER): Payer: 59 | Admitting: Family Medicine

## 2018-09-01 DIAGNOSIS — Z111 Encounter for screening for respiratory tuberculosis: Secondary | ICD-10-CM | POA: Diagnosis not present

## 2018-09-01 NOTE — Patient Instructions (Signed)
Will bring forms from [employer] job

## 2018-09-01 NOTE — Progress Notes (Signed)
  Tuberculosis Risk Questionnaire  1. No Were you born outside the [place] in one of the following parts of the world: [place], [place], [place], [place] or [place]?    2. No Have you traveled outside the [place] and lived for more than one month in one of the following parts of the world: [place], [place], [place], [place] or [place]?    3. Yes  Do you have a compromised immune system such as from any of the following conditions:HIV/AIDS, organ or bone marrow transplantation, diabetes, immunosuppressive medicines (e.g. Prednisone, Remicaide), leukemia, lymphoma, cancer of the head or neck, gastrectomy or jejunal bypass, end-stage renal disease (on dialysis), or silicosis?     4. Yes  Have you ever or do you plan on working in: a residential care center, a health care facility, a jail or prison or homeless shelter?    5. No Have you ever: injected illegal drugs, used crack cocaine, lived in a homeless shelter  or been in jail or prison?     6. No Have you ever been exposed to anyone with infectious tuberculosis?  7. No Have you ever had a BCG vaccine? (BCG is a vaccine for tuberculosis  (TB) used in OTHER countries, NOT in the [place]).  8. No Have you ever been advised by a health care provider NOT to have a TB skin test?  9. No Have you ever had a POSITIVE TB skin test?  IF SO, when? n/a  IF SO, were you treated with INH? n/a  IF SO, where? n/a  Tuberculosis Symptom Questionnaire  Do you currently have any of the following symptoms?  1. No Unexplained cough lasting more than 3 weeks?   2. No Unexplained fever lasting more than 3 weeks.   3. No Night Sweats (sweating that leaves the bedclothes and sheets wet)     4. No Shortness of Breath   5. No Chest Pain   6. No Unintentional weight loss    7. No Unexplained fatigue (very tired for no reason)

## 2018-09-03 ENCOUNTER — Ambulatory Visit (INDEPENDENT_AMBULATORY_CARE_PROVIDER_SITE_OTHER): Payer: 59 | Admitting: Family Medicine

## 2018-09-03 DIAGNOSIS — Z111 Encounter for screening for respiratory tuberculosis: Secondary | ICD-10-CM

## 2018-09-03 LAB — TB SKIN TEST
Induration: 0 mm
TB Skin Test: NEGATIVE

## 2018-09-23 ENCOUNTER — Other Ambulatory Visit: Payer: Self-pay

## 2018-11-01 ENCOUNTER — Ambulatory Visit: Payer: 59 | Admitting: Family Medicine

## 2019-02-18 ENCOUNTER — Telehealth: Payer: Self-pay | Admitting: *Deleted

## 2019-02-18 NOTE — Telephone Encounter (Signed)
-----   Message from [name], [place] sent at 02/17/2019 11:37 PM EDT ----- Regarding: Needs f/u exam Please have patient schedule diabetes follow up and health maintenance appointment at earliest convenience. Thank you!!

## 2019-02-18 NOTE — Telephone Encounter (Signed)
LMOVM for pt to call back and schedule an appt with dr Tarry Kos. Vickye Astorino Kennon Holter, CMA

## 2019-03-21 ENCOUNTER — Other Ambulatory Visit: Payer: Self-pay

## 2019-03-21 ENCOUNTER — Ambulatory Visit (INDEPENDENT_AMBULATORY_CARE_PROVIDER_SITE_OTHER): Payer: 59 | Admitting: Family Medicine

## 2019-03-21 ENCOUNTER — Encounter: Payer: Self-pay | Admitting: Family Medicine

## 2019-03-21 ENCOUNTER — Other Ambulatory Visit (HOSPITAL_COMMUNITY)
Admission: RE | Admit: 2019-03-21 | Discharge: 2019-03-21 | Disposition: A | Discharge status: Home / Self Care | Payer: 59 | Source: Ambulatory Visit | Attending: Family Medicine | Admitting: Family Medicine

## 2019-03-21 VITALS — BP 134/84

## 2019-03-21 DIAGNOSIS — Z124 Encounter for screening for malignant neoplasm of cervix: Secondary | ICD-10-CM

## 2019-03-21 DIAGNOSIS — Z23 Encounter for immunization: Secondary | ICD-10-CM | POA: Diagnosis not present

## 2019-03-21 DIAGNOSIS — E118 Type 2 diabetes mellitus with unspecified complications: Secondary | ICD-10-CM

## 2019-03-21 DIAGNOSIS — Z1211 Encounter for screening for malignant neoplasm of colon: Secondary | ICD-10-CM | POA: Diagnosis not present

## 2019-03-21 DIAGNOSIS — Z1151 Encounter for screening for human papillomavirus (HPV): Secondary | ICD-10-CM | POA: Diagnosis not present

## 2019-03-21 DIAGNOSIS — F17200 Nicotine dependence, unspecified, uncomplicated: Secondary | ICD-10-CM

## 2019-03-21 DIAGNOSIS — I10 Essential (primary) hypertension: Secondary | ICD-10-CM

## 2019-03-21 LAB — POCT GLYCOSYLATED HEMOGLOBIN (HGB A1C): HbA1c, POC (controlled diabetic range): 7.6 % — AB (ref 0.0–7.0)

## 2019-03-21 MED ORDER — EMPAGLIFLOZIN 10 MG PO TABS: 10.0000 mg | ORAL_TABLET | Freq: Every day | ORAL | 3 refills | Status: DC

## 2019-03-21 NOTE — Progress Notes (Signed)
Subjective:   Patient ID: Tara Jarvis    DOB: [birth year], 58 y.o. female   MRN: [id number]  Tara Jarvis is a 58 y.o. female with a history of T2DM, HTN, tobacco use disorder, obesity here for T2DM and HTN follow up.  T2DM: Last A1C 7.7. Today's A1C 7.6. She is currently diet controlled. History of taking Metformin with GI side effects, even with the extend release version. She was discontinued from this medication due to the side effects however she notes she still takes it 1-2 times per week primarily if she feels constipated. If she takes it, she only takes 1 pill a day. Patient denies any polyuria, polydipsia, polyphagia.   HTN:  Blood pressure today 134/84. Currently taking HCTZ 25mg  QD. Endorses compliance. Denies any chest pain, SOB, headaches, dizziness.  Tobacco Use Disorder: Currently an everyday smoker of 1-2 cigarettes per day. Has been smoking since age 20 as much as a 1/2 pack/day. Has considered gradually weaning the number of cigarettes over the next several months.  Denies symptoms of shortness of breath, chest pain, cough, unexplained weight loss.  Health Maintenanc Due for colonoscopy, mammogram, urine microalbumin, diabetic foot exam, flu vaccine, and papsmear  Review of Systems:  Per HPI.   [place], medications and smoking status reviewed.  Objective:   BP 134/84  Vitals and nursing note reviewed.  General: well nourished, well developed, in no acute distress with non-toxic appearance HEENT: normocephalic, atraumatic, moist mucous membranes Neck: supple, non-tender without lymphadenopathy CV: regular rate and rhythm without murmurs, rubs, or gallops, no lower extremity edema Lungs: clear to auscultation bilaterally with normal work of breathing Abdomen: soft, non-tender, non-distended, no masses or organomegaly palpable, normoactive bowel sounds Skin: warm, dry, no rashes or lesions Extremities: warm and well perfused, normal tone MSK: ROM grossly  intact, strength intact, gait normal Neuro: Alert and oriented, speech normal Pelvic exam: normal external genitalia, vulva, vagina, cervix, uterus and adnexa, VULVA: normal appearing vulva with no masses, tenderness or lesions, VAGINA: normal appearing vagina with normal color and discharge, no lesions, CERVIX: normal appearing cervix without discharge or lesions, PAP: Pap smear done today, exam chaperoned by Tara Jarvis.  Diabetic foot exam was performed with the following findings:   No deformities, ulcerations, or other skin breakdown Normal sensation of 10g monofilament Intact posterior tibialis and dorsalis pedis pulses    Assessment & Plan:   Controlled diabetes mellitus with complication, without long-term current use of insulin (HCC) A1C 7.6 today, down from 7.7 in February. Stable with primarily diet control. Instructed patient to discontinue Metformin given GI side effects. Discussed alternatives including SGLT2 vs injectables. Patient is not interested in injectables at this time however was interested in the weight loss affect of the GLP-1's. After discussion, we opted to start Jardiance 10mg  QD. Patient instructed on importance compliance. She understood and agreed.  - start Jardiance 10mg  QD - A1C in 3 months - diabetic foot exam performed today and WNL - Microalbumin/Cr ratio obtained today (history of anaphylaxis with Lisinopril)  Primary hypertension BP normotensive today. Asymptomatic. Has endorsed compliance to antihypertensives. Opted to hold HCTZ for 1 month given starting of Jardiance today. If blood pressure elevated at follow up visit will plan to restart vs starting Amlodipine. She has history of anaphylaxis to ACE. - follow up in 1 month for BP check  - BMP today to monitor kidney function - smoking cessation provided today  Tobacco use disorder Current everyday smoker of 1-2 cigarettes. This is down from prior  visit. Congratulated her on this. She is  interested in weaning down another cigarette.  - Discussed importance of cessation - Goal to decrease 1 cigarette over the next month, patient amendable to this - RTC 1 month   Health Maintenance: - Patient is interested in Cologuard. She denies any history of polyps or family history of colon cancer. Order placed and message sent to Tara Jarvis - Will place mammogram referral at follow up visit. - Pap-smear performed today - Flu vaccine given today - Diabetic foot exam performed today and Microalbumin/Cr ratio obtained   Orders Placed This Encounter  Procedures  . Flu Vaccine QUAD 36+ mos IM  . Microalbumin/Creatinine Ratio, Urine  . Cologuard  . Basic Metabolic Panel  . HgB A1c   Meds ordered this encounter  Medications  . empagliflozin (JARDIANCE) 10 MG TABS tablet    Sig: Take 10 mg by mouth daily.    Dispense:  90 tablet    Refill:  [place], DO PGY-2, [facility] 03/23/2019 2:52 PM

## 2019-03-21 NOTE — Patient Instructions (Signed)
Please stop taking your Hydrochlorothiazide. Please start taking Empagliflozin (Jardiance). This is 1 pill once a day EVERYDAY!.  Please be sure to follow up with me in 1 month to check on your blood pressure.  I will call you with your lab results.   Take care, Dr. Tarry Kos

## 2019-03-22 ENCOUNTER — Encounter: Payer: Self-pay | Admitting: Family Medicine

## 2019-03-22 LAB — BASIC METABOLIC PANEL
BUN/Creatinine Ratio: 17 (ref 9–23)
BUN: 19 mg/dL (ref 6–24)
CO2: 30 mmol/L — ABNORMAL HIGH (ref 20–29)
Calcium: 9.6 mg/dL (ref 8.7–10.2)
Chloride: 100 mmol/L (ref 96–106)
Creatinine, Ser: 1.1 mg/dL — ABNORMAL HIGH (ref 0.57–1.00)
GFR calc Af Amer: 64 mL/min/{1.73_m2} (ref >59)
GFR calc non Af Amer: 56 mL/min/{1.73_m2} — ABNORMAL LOW (ref >59)
Glucose: 202 mg/dL — ABNORMAL HIGH (ref 65–99)
Potassium: 4.4 mmol/L (ref 3.5–5.2)
Sodium: 142 mmol/L (ref 134–144)

## 2019-03-22 LAB — MICROALBUMIN / CREATININE URINE RATIO
Creatinine, Urine: 353.3 mg/dL
Microalb/Creat Ratio: 5 mg/g creat (ref 0–29)
Microalbumin, Urine: 16.9 ug/mL

## 2019-03-23 NOTE — Assessment & Plan Note (Signed)
A1C 7.6 today, down from 7.7 in February. Stable with primarily diet control. Instructed patient to discontinue Metformin given GI side effects. Discussed alternatives including SGLT2 vs injectables. Patient is not interested in injectables at this time however was interested in the weight loss affect of the GLP-1's. After discussion, we opted to start Jardiance 10mg  QD. Patient instructed on importance compliance. She understood and agreed.  - start Jardiance 10mg  QD - A1C in 3 months - diabetic foot exam performed today and WNL - Microalbumin/Cr ratio obtained today (history of anaphylaxis with Lisinopril)

## 2019-03-23 NOTE — Assessment & Plan Note (Addendum)
BP normotensive today. Asymptomatic. Has endorsed compliance to antihypertensives. Opted to hold HCTZ for 1 month given starting of Jardiance today. If blood pressure elevated at follow up visit will plan to restart vs starting Amlodipine. She has history of anaphylaxis to ACE. - follow up in 1 month for BP check  - BMP today to monitor kidney function - smoking cessation provided today

## 2019-03-23 NOTE — Assessment & Plan Note (Signed)
Current everyday smoker of 1-2 cigarettes. This is down from prior visit. Congratulated her on this. She is interested in weaning down another cigarette.  - Discussed importance of cessation - Goal to decrease 1 cigarette over the next month, patient amendable to this - RTC 1 month

## 2019-03-25 LAB — CYTOLOGY - PAP
Adequacy: ABSENT
Diagnosis: NEGATIVE
High risk HPV: NEGATIVE
Molecular Disclaimer: 56
Molecular Disclaimer: NORMAL

## 2019-04-20 ENCOUNTER — Ambulatory Visit: Payer: 59 | Admitting: Student in an Organized Health Care Education/Training Program

## 2019-05-06 ENCOUNTER — Emergency Department (HOSPITAL_COMMUNITY)
Admission: EM | Admit: 2019-05-06 | Discharge: 2019-05-06 | Disposition: A | Discharge status: Home / Self Care | Payer: 59 | Attending: Emergency Medicine | Admitting: Emergency Medicine

## 2019-05-06 ENCOUNTER — Emergency Department (HOSPITAL_COMMUNITY): Payer: 59

## 2019-05-06 ENCOUNTER — Other Ambulatory Visit: Payer: Self-pay

## 2019-05-06 DIAGNOSIS — E119 Type 2 diabetes mellitus without complications: Secondary | ICD-10-CM | POA: Diagnosis not present

## 2019-05-06 DIAGNOSIS — R0602 Shortness of breath: Secondary | ICD-10-CM | POA: Diagnosis present

## 2019-05-06 DIAGNOSIS — I509 Heart failure, unspecified: Secondary | ICD-10-CM | POA: Diagnosis not present

## 2019-05-06 DIAGNOSIS — F17210 Nicotine dependence, cigarettes, uncomplicated: Secondary | ICD-10-CM | POA: Diagnosis not present

## 2019-05-06 DIAGNOSIS — Z79899 Other long term (current) drug therapy: Secondary | ICD-10-CM | POA: Diagnosis not present

## 2019-05-06 DIAGNOSIS — I110 Hypertensive heart disease with heart failure: Secondary | ICD-10-CM | POA: Insufficient documentation

## 2019-05-06 LAB — BASIC METABOLIC PANEL
Anion gap: 9 (ref 5–15)
BUN: 18 mg/dL (ref 6–20)
CO2: 23 mmol/L (ref 22–32)
Calcium: 8.9 mg/dL (ref 8.9–10.3)
Chloride: 105 mmol/L (ref 98–111)
Creatinine, Ser: 0.96 mg/dL (ref 0.44–1.00)
GFR calc Af Amer: >60 mL/min (ref >60)
GFR calc non Af Amer: >60 mL/min (ref >60)
Glucose, Bld: 147 mg/dL — ABNORMAL HIGH (ref 70–99)
Potassium: 3.8 mmol/L (ref 3.5–5.1)
Sodium: 137 mmol/L (ref 135–145)

## 2019-05-06 LAB — CBC WITH DIFFERENTIAL/PLATELET
Abs Immature Granulocytes: 0 10*3/uL (ref 0.00–0.07)
Basophils Absolute: 0 10*3/uL (ref 0.0–0.1)
Basophils Relative: 0 %
Eosinophils Absolute: 0.1 10*3/uL (ref 0.0–0.5)
Eosinophils Relative: 1 %
HCT: 43.4 % (ref 36.0–46.0)
Hemoglobin: 13.6 g/dL (ref 12.0–15.0)
Immature Granulocytes: 0 %
Lymphocytes Relative: 38 %
Lymphs Abs: 2.2 10*3/uL (ref 0.7–4.0)
MCH: 30.2 pg (ref 26.0–34.0)
MCHC: 31.3 g/dL (ref 30.0–36.0)
MCV: 96.2 fL (ref 80.0–100.0)
Monocytes Absolute: 0.4 10*3/uL (ref 0.1–1.0)
Monocytes Relative: 6 %
Neutro Abs: 3.2 10*3/uL (ref 1.7–7.7)
Neutrophils Relative %: 55 %
Platelets: 177 10*3/uL (ref 150–400)
RBC: 4.51 MIL/uL (ref 3.87–5.11)
RDW: 15.6 % — ABNORMAL HIGH (ref 11.5–15.5)
WBC: 5.9 10*3/uL (ref 4.0–10.5)
nRBC: 0 % (ref 0.0–0.2)

## 2019-05-06 LAB — BRAIN NATRIURETIC PEPTIDE: B Natriuretic Peptide: 490.1 pg/mL — ABNORMAL HIGH (ref 0.0–100.0)

## 2019-05-06 MED ORDER — FUROSEMIDE 10 MG/ML IJ SOLN
40.0000 mg | Freq: Once | INTRAMUSCULAR | Status: AC
  Administered 2019-05-06: 40 mg via INTRAVENOUS
  Filled 2019-05-06: qty 4

## 2019-05-06 MED ORDER — HYDRALAZINE HCL 10 MG PO TABS
25.0000 mg | ORAL_TABLET | Freq: Once | ORAL | Status: AC
  Administered 2019-05-06: 25 mg via ORAL
  Filled 2019-05-06: qty 3

## 2019-05-06 NOTE — Discharge Instructions (Addendum)
Resume daily HCTZ as previously prescribed. Follow up with your doctor, call today to schedule an appointment. Return to the ER for any new or worsening symptoms.

## 2019-05-06 NOTE — ED Notes (Signed)
Pt was verbalized discharge instructions. Pt had no further questions at this time. NAD. 

## 2019-05-06 NOTE — ED Provider Notes (Addendum)
[facility] DEPT Provider Note   CSN: [id number] Arrival date & time: 05/06/19  0342     History   Chief Complaint Chief Complaint  Patient presents with  . Palpitations    HPI Tara Jarvis is a 58 y.o. female.     58 year old female with past medical history of diabetes and hypertension presents with complaint of shortness of breath.  Patient states that she woke up around 2:00 in the morning to go to the bathroom per usual and noticed that she was short of breath and could not get comfortable lying down in bed to go back to sleep.  Patient states that she was constantly short of breath until just a few minutes prior to evaluation today.  Patient states that she has felt at times like her heart is racing, denies chest pain, leg swelling.  No history of similar symptoms previously.  Denies fevers, chills, cough.  Patient is a daily smoker, no OCP use, no recent extended travel, no history of PE or DVT.  Patient reports recent change in her blood pressure medication about 1 month ago, has not checked her blood pressure since her medication was changed, does report compliance with medications.  No other complaints or concerns.     Past Medical History:  Diagnosis Date  . Diabetes mellitus without complication ([place])   . Hypertension     Patient Active Problem List   Diagnosis Date Noted  . Tobacco use disorder 08/28/2017  . Primary hypertension 08/28/2017  . Controlled diabetes mellitus with complication, without long-term current use of insulin ([place]) 08/28/2017    Past Surgical History:  Procedure Laterality Date  . BACK SURGERY  1998   Ruptured disc left lumbar     OB History   No obstetric history on file.      Home Medications    Prior to Admission medications   Medication Sig Start Date End Date Taking? Authorizing Provider  brimonidine-timolol (COMBIGAN) 0.2-0.5 % ophthalmic solution Place 1 drop into both eyes every 12  (twelve) hours.  10/08/17  Yes [provider]  empagliflozin (JARDIANCE) 10 MG TABS tablet Take 10 mg by mouth daily. 03/21/19  Yes Mullis, Kiersten P, DO  hydrochlorothiazide (HYDRODIURIL) 25 MG tablet Take 1 tablet (25 mg total) by mouth daily. 08/18/18  Yes Calvary Bing, DO  latanoprost (XALATAN) 0.005 % ophthalmic solution Place 1 drop into both eyes at bedtime. 05/21/17  Yes [provider]    Family History Family History  Problem Relation Age of Onset  . Diabetes Mother   . Hyperlipidemia Mother   . Hypertension Mother   . Kidney disease Mother   . Heart disease Father   . Hypertension Sister   . Hypertension Brother     Social History Social History   Tobacco Use  . Smoking status: Current Every Day Smoker    Packs/day: 0.20  . Smokeless tobacco: Never Used  Substance Use Topics  . Alcohol use: Yes    Alcohol/week: 4.0 standard drinks    Types: 4 Shots of liquor per week  . Drug use: No     Allergies   Lisinopril   Review of Systems Review of Systems  Constitutional: Negative for chills, diaphoresis and fever.  HENT: Negative for congestion.   Eyes: Negative for visual disturbance.  Respiratory: Positive for shortness of breath. Negative for cough, chest tightness and wheezing.   Cardiovascular: Positive for palpitations. Negative for chest pain and leg swelling.  Gastrointestinal: Negative for  abdominal pain, constipation, diarrhea, nausea and vomiting.  Musculoskeletal: Negative for gait problem.  Skin: Negative for rash and wound.  Allergic/Immunologic: Positive for immunocompromised state.  Neurological: Negative for dizziness, speech difficulty, weakness, light-headedness and headaches.  Psychiatric/Behavioral: Negative for confusion.  All other systems reviewed and are negative.    Physical Exam Updated Vital Signs BP (!) 150/106   Pulse 81   Temp 98.8 F (37.1 C) (Oral)   Resp 17   SpO2 99%   Physical Exam Vitals  signs and nursing note reviewed.  Constitutional:      General: She is not in acute distress.    Appearance: She is well-developed. She is not diaphoretic.  HENT:     Head: Normocephalic and atraumatic.  Cardiovascular:     Rate and Rhythm: Normal rate and regular rhythm.     Pulses: Normal pulses.     Heart sounds: Normal heart sounds.  Pulmonary:     Effort: Pulmonary effort is normal.     Breath sounds: Normal breath sounds.  Musculoskeletal:     Right lower leg: No edema.     Left lower leg: No edema.  Skin:    General: Skin is warm and dry.     Findings: No erythema or rash.  Neurological:     Mental Status: She is alert and oriented to person, place, and time.  Psychiatric:        Behavior: Behavior normal.      ED Treatments / Results  Labs (all labs ordered are listed, but only abnormal results are displayed) Labs Reviewed  BASIC METABOLIC PANEL - Abnormal; Notable for the following components:      Result Value   Glucose, Bld 147 (*)    All other components within normal limits  BRAIN NATRIURETIC PEPTIDE - Abnormal; Notable for the following components:   B Natriuretic Peptide 490.1 (*)    All other components within normal limits  CBC WITH DIFFERENTIAL/PLATELET - Abnormal; Notable for the following components:   RDW 15.6 (*)    All other components within normal limits    EKG EKG Interpretation  Date/Time:  Friday May 06 2019 04:03:30 EST Ventricular Rate:  87 PR Interval:    QRS Duration: 77 QT Interval:  451 QTC Calculation: 543 R Axis:   56 Text Interpretation: Sinus rhythm Nonspecific T abnormalities, lateral leads Prolonged QT interval No old tracing to compare Confirmed by Merrily Pew [phone]) on 05/06/2019 6:58:35 AM   Radiology Dg Chest 2 View  Result Date: 05/06/2019 CLINICAL DATA:  Shortness of breath. EXAM: CHEST - 2 VIEW COMPARISON:  No prior. FINDINGS: Mediastinum and hilar structures normal. Borderline cardiomegaly. Mild  pulmonary venous congestion. Mild bilateral interstitial prominence noted. Interstitial edema and/or pneumonitis cannot be excluded. Mild right base subsegmental atelectasis. Follow-up exam suggested to demonstrate clearing and to exclude underlying focal lesion. No pleural effusion or pneumothorax. No acute bony abnormality. IMPRESSION: 1.  Borderline cardiomegaly with mild pulmonary venous congestion. 2. Mild bilateral interstitial prominence noted. Interstitial edema and/or pneumonitis cannot be excluded. 3. Mild right base subsegmental atelectasis. Follow-up exam suggested to demonstrate clearing in order to exclude underlying focal lesion. Electronically Signed   By: Marcello Moores  Register   On: 05/06/2019 05:35    Procedures Procedures (including critical care time)  Medications Ordered in ED Medications  hydrALAZINE (APRESOLINE) tablet 25 mg (25 mg Oral Given 05/06/19 0520)  furosemide (LASIX) injection 40 mg (40 mg Intravenous Given 05/06/19 0657)     Initial Impression / Assessment  and Plan / ED Course  I have reviewed the triage vital signs and the nursing notes.  Pertinent labs & imaging results that were available during my care of the patient were reviewed by me and considered in my medical decision making (see chart for details).  Clinical Course as of May 06 703  Fri May 05, 6873  6030 58 year old female presents with complaint of shortness of breath tonight. Patient was placed supine, reports return of her SHOB, improves with sitting upright. No lower extremity edema. BP is elevated despite taking medications as prescribed.    [LM]  0702 Review of PCP note from last visit, patient was started on Jardiance, was advised to discontinue her HCTZ and recheck in the office in 1 month, plan was to restart HCTZ or amlodipine if her blood pressure remained elevated. Discussed this with patient who now states that she has not actually taking her HCTZ currently.  Symptoms have resolved,  patient is able to ambulate without difficulty or drop in her ambulatory pulse ox.  Patient is able to lie supine without return of symptoms.  Chest x-ray shows mild pulmonary venous congestion, BNP is elevated at 490, CBC and BMP without significant findings.  Blood pressure has improved with 1 dose of hydralazine.   Case discussed with Dr. Dolly Rias, ER attending.  Plan is to give 1 dose of Lasix in the ER, restart her HCTZ and follow-up with her PCP.  Message sent to patient's PCP regarding patient's visit to the ER today with plan for follow-up.  To return to the ER for any new or worsening symptoms.   [LM]    Clinical Course User Index [LM] Tacy Learn, PA-C       Final Clinical Impressions(s) / ED Diagnoses   Final diagnoses:  Acute congestive heart failure, unspecified heart failure type [facility])    ED Discharge Orders    None       Tacy Learn, PA-C 05/06/19 0703    Tacy Learn, PA-C 05/06/19 [id number]    Merrily Pew, MD 05/06/19 [phone]

## 2019-05-06 NOTE — ED Notes (Signed)
Pt ambulated to restroom without assistance. Gait steady  

## 2019-05-06 NOTE — ED Notes (Signed)
Pt denies chest pain

## 2019-05-06 NOTE — ED Triage Notes (Signed)
Per EMS: Pt is coming from home with a c/o palpitations. Pt states it feels like her heart is racing and this feeling started an hour ago. Patients lung sounds were clear and she reports no dizziness. Pt is a current smoker and has a history of HTN and diabetes.  EMS VITALS:  BP 189/120 HR 85  CBG 227 TEMP 97.5

## 2019-05-12 ENCOUNTER — Ambulatory Visit (INDEPENDENT_AMBULATORY_CARE_PROVIDER_SITE_OTHER): Payer: 59 | Admitting: Family Medicine

## 2019-05-12 ENCOUNTER — Other Ambulatory Visit: Payer: Self-pay

## 2019-05-12 ENCOUNTER — Encounter: Payer: Self-pay | Admitting: Family Medicine

## 2019-05-12 VITALS — BP 120/64 | HR 66 | Wt 228.1 lb

## 2019-05-12 DIAGNOSIS — R0600 Dyspnea, unspecified: Secondary | ICD-10-CM

## 2019-05-12 DIAGNOSIS — E785 Hyperlipidemia, unspecified: Secondary | ICD-10-CM

## 2019-05-12 DIAGNOSIS — E118 Type 2 diabetes mellitus with unspecified complications: Secondary | ICD-10-CM

## 2019-05-12 DIAGNOSIS — R0609 Other forms of dyspnea: Secondary | ICD-10-CM

## 2019-05-12 DIAGNOSIS — E1169 Type 2 diabetes mellitus with other specified complication: Secondary | ICD-10-CM

## 2019-05-12 DIAGNOSIS — I10 Essential (primary) hypertension: Secondary | ICD-10-CM | POA: Diagnosis not present

## 2019-05-12 MED ORDER — ATORVASTATIN CALCIUM 40 MG PO TABS: 40.0000 mg | ORAL_TABLET | Freq: Every day | ORAL | 3 refills | Status: DC

## 2019-05-12 NOTE — Patient Instructions (Signed)
It was a pleasure to see you today! Thank you for choosing [facility] for your primary care. Tara Jarvis was seen for emergency department follow-up.   We are going to get an echocardiogram of your heart.  This will tell us degree which your heart is pumping.  We are starting you on a cholesterol medicine.  We are also checking your cholesterol levels today.  Please follow-up with your regular doctor regarding for diabetes 2 months.   Best,  Marny Lowenstein, MD, MS FAMILY MEDICINE RESIDENT - PGY3 05/12/2019 2:56 PM

## 2019-05-12 NOTE — Progress Notes (Signed)
    Subjective:  Tara Jarvis is a 58 y.o. female who [facility] with a chief complaint of emergency department follow-up.   HPI:  Patient was seen in the emergency department on 11/13 for findings suggestive of acute onset heart failure.  This included elevated BNP, patient with dyspnea, chest x-ray findings of breath pulmonary edema.  Patient has no prior history of congestive heart failure.  She has not had echocardiogram.  Patient was recently discontinued off of her HCTZ diuretic approximately 1 month prior to this by her PCP when she was started on Jardiance.  At the ED, she was given 1 dose of Lasix and restarted on her HCTZ.  Patient says that since she was restarted on HCTZ at the emergency department she has had no symptoms of chest pain or shortness of breath.  Patient denies any lower extremity swelling.    ROS: Per HPI  PMH: Diabetes mellitus, hyperlipidemia    Objective:  Physical Exam: BP 120/64   Pulse 66   Wt 228 lb 2 oz (103.5 kg)   SpO2 98%   BMI 35.73 kg/m   Gen: NAD, resting comfortably CV: RRR with no murmurs appreciated, no JVD Pulm: NWOB, CTAB with no crackles, wheezes, or rhonchi MSK: Trace lower extremity edema at the ankles, no cyanosis,  Skin: warm, dry Neuro: grossly normal, moves all extremities Psych: Normal affect and thought content  No results found for this or any previous visit (from the past 72 hour(s)).   Assessment/Plan:  Dyspnea on exertion Patient recent episode of the ED that was consistent with acute onset heart failure.  What is interesting was that she was only getting 1 dose of Lasix, and restarted on HCTZ and had complete resolution of symptoms.  Patient does not appear to be in acute heart failure at this moment.  Recommend getting an echocardiogram.  If patient with side findings of heart failure, recommending the to Lasix and have the patient follow-up with you for further management.  Hyperlipidemia  associated with type 2 diabetes mellitus ([place]) Patient with a history of hyperlipidemia and diabetes mellitus.  Patient is not on a statin.  Recommending repeat lipid panel starting a moderate intensity statin.  Patient follow-up PCP discuss further management.    Lab Orders     Lipid panel  Meds ordered this encounter  Medications  . atorvastatin (LIPITOR) 40 MG tablet    Sig: Take 1 tablet (40 mg total) by mouth daily.    Dispense:  90 tablet    Refill:  3      Marny Lowenstein, MD, [place] - PGY2 05/14/2019 12:38 PM

## 2019-05-14 ENCOUNTER — Encounter: Payer: Self-pay | Admitting: Family Medicine

## 2019-05-14 DIAGNOSIS — E785 Hyperlipidemia, unspecified: Secondary | ICD-10-CM | POA: Insufficient documentation

## 2019-05-14 DIAGNOSIS — R0609 Other forms of dyspnea: Secondary | ICD-10-CM | POA: Insufficient documentation

## 2019-05-14 DIAGNOSIS — E1169 Type 2 diabetes mellitus with other specified complication: Secondary | ICD-10-CM | POA: Insufficient documentation

## 2019-05-14 NOTE — Assessment & Plan Note (Signed)
Patient recent episode of the ED that was consistent with acute onset heart failure.  What is interesting was that she was only getting 1 dose of Lasix, and restarted on HCTZ and had complete resolution of symptoms.  Patient does not appear to be in acute heart failure at this moment.  Recommend getting an echocardiogram.  If patient with side findings of heart failure, recommending the to Lasix and have the patient follow-up with you for further management.

## 2019-05-14 NOTE — Assessment & Plan Note (Signed)
Patient with a history of hyperlipidemia and diabetes mellitus.  Patient is not on a statin.  Recommending repeat lipid panel starting a moderate intensity statin.  Patient follow-up PCP discuss further management.

## 2019-05-16 ENCOUNTER — Other Ambulatory Visit: Payer: Self-pay

## 2019-05-16 ENCOUNTER — Ambulatory Visit (HOSPITAL_COMMUNITY)
Admission: RE | Admit: 2019-05-16 | Discharge: 2019-05-16 | Disposition: A | Discharge status: Home / Self Care | Payer: 59 | Source: Ambulatory Visit | Attending: Family Medicine | Admitting: Family Medicine

## 2019-05-16 DIAGNOSIS — R0609 Other forms of dyspnea: Secondary | ICD-10-CM

## 2019-05-16 DIAGNOSIS — R0600 Dyspnea, unspecified: Secondary | ICD-10-CM

## 2019-05-16 NOTE — Progress Notes (Signed)
  Echocardiogram 2D Echocardiogram has been performed.  Tara Jarvis M 05/16/2019, 3:33 PM

## 2019-05-23 ENCOUNTER — Other Ambulatory Visit: Payer: Self-pay

## 2019-05-23 ENCOUNTER — Ambulatory Visit (INDEPENDENT_AMBULATORY_CARE_PROVIDER_SITE_OTHER): Payer: 59 | Admitting: Family Medicine

## 2019-05-23 VITALS — BP 130/78 | HR 69 | Wt 230.4 lb

## 2019-05-23 DIAGNOSIS — E1169 Type 2 diabetes mellitus with other specified complication: Secondary | ICD-10-CM | POA: Diagnosis not present

## 2019-05-23 DIAGNOSIS — F17200 Nicotine dependence, unspecified, uncomplicated: Secondary | ICD-10-CM | POA: Diagnosis not present

## 2019-05-23 DIAGNOSIS — I5021 Acute systolic (congestive) heart failure: Secondary | ICD-10-CM

## 2019-05-23 DIAGNOSIS — I5020 Unspecified systolic (congestive) heart failure: Secondary | ICD-10-CM

## 2019-05-23 DIAGNOSIS — E785 Hyperlipidemia, unspecified: Secondary | ICD-10-CM

## 2019-05-23 MED ORDER — METOPROLOL SUCCINATE ER 25 MG PO TB24: 12.5000 mg | ORAL_TABLET | Freq: Every day | ORAL | 0 refills | Status: DC

## 2019-05-23 NOTE — Assessment & Plan Note (Signed)
Has quit smoking since discovering news of heart failure.  Congratulated on efforts.  We will continue to assess efforts at follow-up.

## 2019-05-23 NOTE — Patient Instructions (Signed)
It was great to see you!  Our plans for today:  - We are starting a new medication to help prevent future heart damage.  Take 1/2 tablet of metoprolol (12.5mg ) daily. - We are referring you to cardiology to be established. - Come back in 1 month for follow-up or sooner if you develop chest pain, difficulties breathing, leg swelling, heart racing, or dizziness.  We are checking some labs today, we will call you or send you a letter if they are abnormal.   Take care and seek immediate care sooner if you develop any concerns.   Dr. [facility]

## 2019-05-23 NOTE — Progress Notes (Signed)
  Subjective:   Patient ID: Tara Jarvis    DOB: 04/09/1961, 58 y.o. female   MRN: [id number]  Tara Jarvis is a 58 y.o. female with a history of HTN, T2 DM, hyperlipidemia, tobacco use here for   Acute onset HFmrEF - Seen previously 11/19 at [facility] and 11/13 in [facility] ED for dyspnea on exertion, was diagnosed with acute onset CHF.  Echo obtained 11/23 with LVEF 40-45% - Currently on HCTZ, Jardiance, statin without issues. -Reports over the weekend she noted some intermittent palpitations and her "heart beating harder."  Denies chest pain, SOB, nausea, LE swelling or pain. - never seen cardiologist - father died of heart attack in late 67s - Reports sugars have been well controlled - has stopped smoking since last visit - Would like note to return to work  Review of Systems:  Per HPI.  Medications and smoking status reviewed.  Objective:   BP 130/78   Pulse 69   Wt 230 lb 6 oz (104.5 kg)   SpO2 98%   BMI 36.08 kg/m  Vitals and nursing note reviewed.  General: Overweight female, in no acute distress with non-toxic appearance CV: regular rate and rhythm without murmurs, rubs, or gallops, no lower extremity edema Lungs: clear to auscultation bilaterally with normal work of breathing Neuro: Alert and oriented, speech normal  Assessment & Plan:   Tobacco use disorder Has quit smoking since discovering news of heart failure.  Congratulated on efforts.  We will continue to assess efforts at follow-up.  HFrEF (heart failure with reduced ejection fraction) ([place]) HFmrEF visualized on echo 05/16/2019 with LVEF 40-45%.  Currently asymptomatic and tolerating medications well.  Will add low-dose beta-blocker to her regimen.  Will obtain lipid panel today and adjust statin dose as needed.  Will also refer to cardiology for establishment.  Likely would need to change HCTZ to Lasix, but will defer to cardiology given patient is currently doing well and is asymptomatic.  Follow-up in 1 month.   Return precautions discussed, see AVS for details.  Hyperlipidemia associated with type 2 diabetes mellitus ([place]) Will obtain updated lipid panel today and adjust statin dose as needed.  Orders Placed This Encounter  Procedures  . Lipid Panel  . Ambulatory referral to Cardiology    Referral Priority:   Routine    Referral Type:   Consultation    Referral Reason:   Specialty Services Required    Requested Specialty:   Cardiology    Number of Visits Requested:   1   Meds ordered this encounter  Medications  . metoprolol succinate (TOPROL XL) 25 MG 24 hr tablet    Sig: Take 0.5 tablets (12.5 mg total) by mouth daily.    Dispense:  45 tablet    Refill:  0    Rory Percy, DO PGY-3, [facility] 05/23/2019 9:38 AM

## 2019-05-23 NOTE — Assessment & Plan Note (Signed)
Will obtain updated lipid panel today and adjust statin dose as needed.

## 2019-05-23 NOTE — Assessment & Plan Note (Signed)
HFmrEF visualized on echo 05/16/2019 with LVEF 40-45%.  Currently asymptomatic and tolerating medications well.  Will add low-dose beta-blocker to her regimen.  Will obtain lipid panel today and adjust statin dose as needed.  Will also refer to cardiology for establishment.  Likely would need to change HCTZ to Lasix, but will defer to cardiology given patient is currently doing well and is asymptomatic.  Follow-up in 1 month.  Return precautions discussed, see AVS for details.

## 2019-05-24 LAB — LIPID PANEL
Chol/HDL Ratio: 3 ratio (ref 0.0–4.4)
Cholesterol, Total: 213 mg/dL — ABNORMAL HIGH (ref 100–199)
HDL: 71 mg/dL (ref >39)
LDL Chol Calc (NIH): 120 mg/dL — ABNORMAL HIGH (ref 0–99)
Triglycerides: 126 mg/dL (ref 0–149)
VLDL Cholesterol Cal: 22 mg/dL (ref 5–40)

## 2019-05-24 NOTE — Addendum Note (Signed)
Addended by: Myles Gip on: 05/24/2019 05:19 PM   Modules accepted: Orders

## 2019-06-06 ENCOUNTER — Ambulatory Visit: Payer: 59 | Admitting: Family Medicine

## 2019-06-06 ENCOUNTER — Telehealth: Payer: Self-pay

## 2019-06-06 NOTE — Telephone Encounter (Signed)
Called patient concerning Cologuard.  Patient has the kit and put it away.  Patient forgot to complete and return it.  Explained to patient that she only has to complete it and call for pick up if she does not want to take it to [place]. Also advised patient to place kit in bathroom so as to remember to collect sample.  Patient states that she will try placing the kit in the bathroom to jog her memory.  Patient states that she will complete it within the next two weeks.  Tara Jarvis, [name]

## 2019-06-09 ENCOUNTER — Other Ambulatory Visit: Payer: Self-pay | Admitting: Family Medicine

## 2019-06-09 DIAGNOSIS — Z1211 Encounter for screening for malignant neoplasm of colon: Secondary | ICD-10-CM

## 2019-06-09 DIAGNOSIS — Z1231 Encounter for screening mammogram for malignant neoplasm of breast: Secondary | ICD-10-CM

## 2019-06-20 ENCOUNTER — Encounter: Payer: Self-pay | Admitting: Family Medicine

## 2019-06-20 ENCOUNTER — Other Ambulatory Visit: Payer: Self-pay

## 2019-06-20 ENCOUNTER — Ambulatory Visit (INDEPENDENT_AMBULATORY_CARE_PROVIDER_SITE_OTHER): Payer: 59 | Admitting: Family Medicine

## 2019-06-20 VITALS — BP 130/90 | HR 70 | Ht 67.0 in | Wt 229.0 lb

## 2019-06-20 DIAGNOSIS — I10 Essential (primary) hypertension: Secondary | ICD-10-CM | POA: Diagnosis not present

## 2019-06-20 DIAGNOSIS — Z0000 Encounter for general adult medical examination without abnormal findings: Secondary | ICD-10-CM

## 2019-06-20 DIAGNOSIS — E785 Hyperlipidemia, unspecified: Secondary | ICD-10-CM

## 2019-06-20 DIAGNOSIS — E1169 Type 2 diabetes mellitus with other specified complication: Secondary | ICD-10-CM | POA: Diagnosis not present

## 2019-06-20 DIAGNOSIS — E118 Type 2 diabetes mellitus with unspecified complications: Secondary | ICD-10-CM | POA: Diagnosis not present

## 2019-06-20 LAB — POCT GLYCOSYLATED HEMOGLOBIN (HGB A1C): HbA1c, POC (controlled diabetic range): 8.1 % — AB (ref 0.0–7.0)

## 2019-06-20 NOTE — Progress Notes (Signed)
Subjective:    Patient ID: Tara Jarvis, female    DOB: 02/08/1961, 58 y.o.   MRN: [id number]   CC: f/u T2DM (A1c check), HTN, Health Maintenance  HPI: T2DM: Last A1c in September 2020 7.6%, today is 8.1%, says she was eating too many sweets these past few months. Patient was started on Jardiance in September 2020. Was previously on Metformin but did not tolerate twice daily dosing 2/2 GI side effects. Last BMP in Nov 2020 and last Microalbumin/Cr ratio (Sept 2020) showed normal parameters. Denies polyuria, polyphagia, polydipsia, neuropathy, vision changes, headaches, fevers, shortness of breath, chest pain, and weight loss.  HTN: today's BP currently 130/90. Well controlled with HCTZ 25mg  and Metoprolol 25mg  daily. Is taking Jardiance which can help protect kidneys. Would benefit from ARB, however has history of anaphylaxis on Lisinopril.  HFrEF: patient recently diagnosed with HFrEF in November 2020 on Echo, found to have the following "Left Ventricle: Left ventricular EF, by visual estimation, is 40-45%. LF has mildly decreased function. There is no LVH. Left ventricular diastolic parameters were normal. Right Ventricle: grossly normal size and function." Patient is currently on HCTZ 25mg  daily and Metoprolol 25mg  daily. Denies paroxysmal nocturnal dyspnea, reports she can lie flat to sleep.  Health maintenance: patient is due for Mammogram and Cologuard/Colonoscopy. Patient took home Cologuard in September 2020 to be completed, however she forgot to do it. She denies any weight loss, rectal bleeding, melena, and fatigue. Mammogram was ordered for patient today. I also recommended she get a colonoscopy, she is ok with getting one in March 2021.  Smoking status reviewed - currently smoking 1-2 cigarettes daily. Patient is encouraged to quit smoking!  Review of Systems - see HPI  Objective:  BP 130/90   Pulse 70   Ht 5\' 7"  (1.702 m)   Wt 229 lb (103.9 kg)   BMI 35.87 kg/m  Vitals  and nursing note reviewed  PHYSICAL EXAM General: well nourished, in no acute distress Neck: no JVD Cardiac: RRR, clear S1 and S2, no murmurs or gallops appreciated Respiratory: CTA bilaterally, no crackles appreciated, comfortable work of breathing Abdomen: soft, nontender, normal bowel sounds appreciated Extremities: trace edema appreciated to bilateral lower extremities, 2+ DP pulses Skin: no rashes appreciated  Lipid Panel     Component Value Date/Time   CHOL 193 06/20/2019 1452   TRIG 122 06/20/2019 1452   HDL 78 06/20/2019 1452   CHOLHDL 2.5 06/20/2019 1452   LDLCALC 94 06/20/2019 1452   LABVLDL 21 06/20/2019 1452   The 10-year ASCVD risk score [name]., et al., 2013) is: 19%   Values used to calculate the score:     Age: 69 years     Sex: Female     Is Non-Hispanic African American: Yes     Diabetic: Yes     Tobacco smoker: Yes     Systolic Blood Pressure: [id number]     Is BP treated: Yes     HDL Cholesterol: 78 mg/dL     Total Cholesterol: 193 mg/dL  Assessment & Plan:   Controlled diabetes mellitus with complication, without long-term current use of insulin ([facility]) HbA1c increased at 8.1%, was 7.6% 3 months prior, patient reports poor diet with higher carbohydrate intake as culprit.  -Patient to continue Jardiance 10mg  daily -Will start Metformin 500mg  once daily (had previously stopped taking med due to GI side effects, however this was twice daily dosing). -Check lipid and thyroid panel today -Cut back consuming regular sodas -  instead try sweet tea with Splenda, diet sodas. -Suggested nutrition counseling to patient who would like to think about it  Hyperlipidemia associated with type 2 diabetes mellitus ([place]) ASCVD risk 19%. -Checking cholesterol/lipid panel today - lipid panel within normal limits -Continue Atorvastatin 40mg  daily -Patient encouraged to cut back on carbohydrates -Recommend nutrition counseling for next appt  Primary hypertension BP  today 130/90.  -Continue HCTZ 25mg  and Metoprolol 25mg  daily. 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    Encounter for screening and preventative care Patient due for routine screening Mammogram and Colonoscopy. -Mammogram and Colonoscopy ordered this encounter   Return in about 3 months (around 09/18/2019).   Dr. Milus Banister [facility], PGY-2

## 2019-06-20 NOTE — Patient Instructions (Signed)
Thank you for coming in to see Tara Jarvis today! Please see below to review our plan for today's visit:  1. Please go get your screening mammogram as soon as possible. You are being referred to GI for colonoscopy. 2. Please continue Metoprolol for Heart Failure, and HCTZ for HF and high blood pressure. 3. Continue Metformin 500mg  daily and Jardiance 10mg  for control of diabetes. 4. We are checking your cholesterol panel and thyroid function today. 5. Cut back consuming regular sodas - drink sweet tea with Splenda, drink diet sodas. 6. Please let Tara Jarvis know if we can do some nutrition counseling next time  Please call the clinic at [phone] if your symptoms worsen or you have any concerns. It was our pleasure to serve you!    Dr. Milus Banister [facility]

## 2019-06-21 LAB — THYROID PANEL WITH TSH
Free Thyroxine Index: 1.7 (ref 1.2–4.9)
T3 Uptake Ratio: 29 % (ref 24–39)
T4, Total: 6 ug/dL (ref 4.5–12.0)
TSH: 1.33 u[IU]/mL (ref 0.450–4.500)

## 2019-06-21 LAB — LIPID PANEL
Chol/HDL Ratio: 2.5 ratio (ref 0.0–4.4)
Cholesterol, Total: 193 mg/dL (ref 100–199)
HDL: 78 mg/dL (ref >39)
LDL Chol Calc (NIH): 94 mg/dL (ref 0–99)
Triglycerides: 122 mg/dL (ref 0–149)
VLDL Cholesterol Cal: 21 mg/dL (ref 5–40)

## 2019-06-25 DIAGNOSIS — Z1211 Encounter for screening for malignant neoplasm of colon: Secondary | ICD-10-CM | POA: Insufficient documentation

## 2019-06-25 DIAGNOSIS — Z0000 Encounter for general adult medical examination without abnormal findings: Secondary | ICD-10-CM | POA: Insufficient documentation

## 2019-06-25 NOTE — Assessment & Plan Note (Signed)
HbA1c increased at 8.1%, was 7.6% 3 months prior, patient reports poor diet with higher carbohydrate intake as culprit.  -Patient to continue Jardiance 10mg  daily -Will start Metformin 500mg  once daily (had previously stopped taking med due to GI side effects, however this was twice daily dosing). -Check lipid and thyroid panel today -Cut back consuming regular sodas - instead try sweet tea with Splenda, diet sodas. -Suggested nutrition counseling to patient who would like to think about it

## 2019-06-25 NOTE — Assessment & Plan Note (Signed)
ASCVD risk 19%. -Checking cholesterol/lipid panel today - lipid panel within normal limits -Continue Atorvastatin 40mg  daily -Patient encouraged to cut back on carbohydrates -Recommend nutrition counseling for next appt

## 2019-06-25 NOTE — Assessment & Plan Note (Signed)
Patient due for routine screening Mammogram and Colonoscopy. -Mammogram and Colonoscopy ordered this encounter

## 2019-06-25 NOTE — Assessment & Plan Note (Signed)
BP today 130/90.  -Continue HCTZ 25mg  and Metoprolol 25mg  daily. 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

## 2019-06-29 ENCOUNTER — Telehealth: Payer: Self-pay

## 2019-06-29 NOTE — Telephone Encounter (Signed)
Pt calling to get results from lab work done last week and would like a referral to go to  [facility] [phone]. Please call pt and let her know when referral has been done. Ottis Stain, CMA

## 2019-07-06 ENCOUNTER — Other Ambulatory Visit: Payer: Self-pay | Admitting: Family Medicine

## 2019-07-08 ENCOUNTER — Other Ambulatory Visit: Payer: Self-pay | Admitting: Family Medicine

## 2019-07-08 DIAGNOSIS — Z1389 Encounter for screening for other disorder: Secondary | ICD-10-CM

## 2019-07-12 NOTE — Telephone Encounter (Signed)
LVM with below information. Did not say pt name or any identifying information as voicemail is generic. Ottis Stain, CMA

## 2019-07-13 ENCOUNTER — Telehealth: Payer: Self-pay

## 2019-07-13 ENCOUNTER — Other Ambulatory Visit: Payer: Self-pay | Admitting: Family Medicine

## 2019-07-13 DIAGNOSIS — L659 Nonscarring hair loss, unspecified: Secondary | ICD-10-CM | POA: Diagnosis not present

## 2019-07-13 DIAGNOSIS — E118 Type 2 diabetes mellitus with unspecified complications: Secondary | ICD-10-CM

## 2019-07-13 MED ORDER — METFORMIN HCL ER 500 MG PO TB24: 500.0000 mg | ORAL_TABLET | Freq: Every day | ORAL | 3 refills | Status: DC

## 2019-07-13 NOTE — Telephone Encounter (Signed)
Please inform patient refill of Metformin has been provided. Please let her know that it is important for her to take on a full stomach as this will help decrease side effects. Thank you.

## 2019-07-13 NOTE — Telephone Encounter (Signed)
Patient calls nurse line stating she ran out of Metformin this week. Patient stated she was told to restart this medication at last office visit. Please send in Metformin to pharmacy.

## 2019-07-14 NOTE — Telephone Encounter (Signed)
Pt called and informed of below. Pt will pick up rx this afternoon.   Talbot Grumbling, RN

## 2019-07-25 ENCOUNTER — Other Ambulatory Visit: Payer: Self-pay

## 2019-07-25 ENCOUNTER — Encounter (INDEPENDENT_AMBULATORY_CARE_PROVIDER_SITE_OTHER): Payer: Self-pay

## 2019-07-25 ENCOUNTER — Ambulatory Visit (INDEPENDENT_AMBULATORY_CARE_PROVIDER_SITE_OTHER): Payer: Medicare Other | Admitting: Interventional Cardiology

## 2019-07-25 ENCOUNTER — Encounter: Payer: Self-pay | Admitting: Interventional Cardiology

## 2019-07-25 VITALS — BP 122/78 | HR 73 | Ht 67.0 in | Wt 231.0 lb

## 2019-07-25 DIAGNOSIS — E119 Type 2 diabetes mellitus without complications: Secondary | ICD-10-CM | POA: Diagnosis not present

## 2019-07-25 DIAGNOSIS — Z01812 Encounter for preprocedural laboratory examination: Secondary | ICD-10-CM | POA: Diagnosis not present

## 2019-07-25 DIAGNOSIS — I10 Essential (primary) hypertension: Secondary | ICD-10-CM

## 2019-07-25 DIAGNOSIS — I5022 Chronic systolic (congestive) heart failure: Secondary | ICD-10-CM | POA: Diagnosis not present

## 2019-07-25 DIAGNOSIS — R0602 Shortness of breath: Secondary | ICD-10-CM | POA: Diagnosis not present

## 2019-07-25 MED ORDER — METOPROLOL TARTRATE 50 MG PO TABS: ORAL_TABLET | ORAL | 0 refills | Status: DC

## 2019-07-25 NOTE — Patient Instructions (Addendum)
Medication Instructions:  Your physician recommends that you continue on your current medications as directed. Please refer to the Current Medication list given to you today.  If you need a refill on your cardiac medications before your next appointment, please call your pharmacy.   Lab work: Your physician recommends that you return for lab work Artist) prior to Cardiac CT  If you have labs (blood work) drawn today and your tests are completely normal, you will receive your results only by: Marland Kitchen MyChart Message (if you have MyChart) OR . A paper copy in the mail If you have any lab test that is abnormal or we need to change your treatment, we will call you to review the results.  Testing/Procedures: Your physician has requested that you have cardiac CT. Cardiac computed tomography (CT) is a painless test that uses an x-ray machine to take clear, detailed pictures of your heart. For further information please visit [email or web address][facility]tn. Please follow instruction sheet as given.   Follow-Up: . Based on test results  Any Other Special Instructions Will Be Listed Below (If Applicable).  CARDIAC CT INSTRUCTIONS Your cardiac CT will be scheduled at one of the below locations:   [facility] [address] [place], [place] [address] [phone]) [place] [address] [place], [place] [address] [phone]  If scheduled at [facility], please arrive at the [facility] main entrance of [facility] 30-45 minutes prior to test start time. Proceed to the [facility] Radiology Department (first floor) to check-in and test prep.  If scheduled at [facility], please arrive 15 mins early for check-in and test prep.  Please follow these instructions carefully (unless otherwise directed):  On the Night Before the Test: . Be sure to Drink plenty of water. . Do not consume any  caffeinated/decaffeinated beverages or chocolate 12 hours prior to your test. . Do not take any antihistamines 12 hours prior to your test. . DO NOT take metformin 24 hours prior to test   On the Day of the Test: . Drink plenty of water. Do not drink any water within one hour of the test. . Do not eat any food 4 hours prior to the test. . You may take your regular medications prior to the test.  . Take metoprolol (Lopressor) 50 MG two hours prior to test. . HOLD Hydrochlorothiazide morning of the test. . DO NOT take metformin the day of your procedure . DO NOT take jardiance the day of the procedure . FEMALES- please wear underwire-free bra if available       After the Test: . Drink plenty of water. . After receiving IV contrast, you may experience a mild flushed feeling. This is normal. . On occasion, you may experience a mild rash up to 24 hours after the test. This is not dangerous. If this occurs, you can take Benadryl 25 mg and increase your fluid intake. . If you experience trouble breathing, this can be serious. If it is severe call 911 IMMEDIATELY. If it is mild, please call our office. . Please do not take metformin 48 hours after completing test   Once we have confirmed authorization from your insurance company, we will call you to set up a date and time for your test.   For non-scheduling related questions, please contact the cardiac imaging nurse navigator should you have any questions/concerns: Marchia Bond, RN Navigator Cardiac Imaging [facility] [phone] Office

## 2019-07-25 NOTE — Progress Notes (Signed)
Cardiology Office Note:    Date:  07/25/2019   ID:  Tara Jarvis, DOB 09-01-1960, MRN [id number]  PCP:  [name], DO  Cardiologist:  Larae Grooms, MD  Electrophysiologist:  None   Referring MD: [name], MD   No chief complaint on file.  Chronic systolic heart failure History of Present Illness:    Tara Jarvis is a 59 y.o. female with a hx of DM2, HTN.  She had an echo in 04/2019 or DOE/pulm edema and was found decreased left ventricular function with ejection fraction 40 to 45%.  At the time, BP meds were being changed at the time. Severe SHOB, but no CP at the time.  She was diuresed.    Blood pressure has been managed with medications.  HCTZ was restarted and fluid status has been controlled.   She walks a lot at work.  She feels well.  She feels that her BP has been doing well.  She decreased salt intake and stopped smoking.     Since the trip to the ER in 04/2019: "Denies : Chest pain. Dizziness. Leg edema. Nitroglycerin use. Orthopnea. Palpitations. Paroxysmal nocturnal dyspnea. Shortness of breath. Syncope. "   Past Medical History:  Diagnosis Date  . Diabetes mellitus without complication ([place])   . Hypertension     Past Surgical History:  Procedure Laterality Date  . BACK SURGERY  1998   Ruptured disc left lumbar    Current Medications: Current Meds  Medication Sig  . atorvastatin (LIPITOR) 40 MG tablet Take 1 tablet (40 mg total) by mouth daily.  . brimonidine-timolol (COMBIGAN) 0.2-0.5 % ophthalmic solution Place 1 drop into both eyes every 12 (twelve) hours.   . empagliflozin (JARDIANCE) 10 MG TABS tablet Take 10 mg by mouth daily.  . hydrochlorothiazide (HYDRODIURIL) 25 MG tablet Take 1 tablet (25 mg total) by mouth daily.  Marland Kitchen latanoprost (XALATAN) 0.005 % ophthalmic solution Place 1 drop into both eyes at bedtime.  . metFORMIN (GLUCOPHAGE-XR) 500 MG 24 hr tablet Take 1 tablet (500 mg total) by mouth daily with breakfast. Please  be sure to take with a full stomach to help decrease side effects.  . metoprolol succinate (TOPROL-XL) 25 MG 24 hr tablet TAKE 1/2 TABLET BY MOUTH EVERY DAY     Allergies:   Lisinopril   Social History   Socioeconomic History  . Marital status: Legally Separated    Spouse name: Not on file  . Number of children: Not on file  . Years of education: Not on file  . Highest education level: Not on file  Occupational History  . Not on file  Tobacco Use  . Smoking status: Current Every Day Smoker    Packs/day: 0.20  . Smokeless tobacco: Never Used  Substance and Sexual Activity  . Alcohol use: Yes    Alcohol/week: 4.0 standard drinks    Types: 4 Shots of liquor per week  . Drug use: No  . Sexual activity: Not on file  Other Topics Concern  . Not on file  Social History Narrative   Lives in [place] with cousin Casilda Carls. Works for home health care.   Social Determinants of Health   Financial Resource Strain:   . Difficulty of Paying Living Expenses: Not on file  Food Insecurity:   . Worried About Charity fundraiser in the Last Year: Not on file  . Ran Out of Food in the Last Year: Not on file  Transportation Needs:   . Lack  of Transportation (Medical): Not on file  . Lack of Transportation (Non-Medical): Not on file  Physical Activity:   . Days of Exercise per Week: Not on file  . Minutes of Exercise per Session: Not on file  Stress:   . Feeling of Stress : Not on file  Social Connections:   . Frequency of Communication with Friends and Family: Not on file  . Frequency of Social Gatherings with Friends and Family: Not on file  . Attends Religious Services: Not on file  . Active Member of Clubs or Organizations: Not on file  . Attends Archivist Meetings: Not on file  . Marital Status: Not on file     Family History: The patient's family history includes Diabetes in her mother; Heart disease in her father; Hyperlipidemia in her mother;  Hypertension in her brother, mother, and sister; Kidney disease in her mother.  ROS:   Please see the history of present illness.    BP better controlled.  All other systems reviewed and are negative.  EKGs/Labs/Other Studies Reviewed:    The following studies were reviewed today: Echo results reviewed  EKG:  EKG was ordered in November 2020.  The ekg ordered today demonstrates normal sinus rhythm, nonspecific ST-T wave changes  Recent Labs: 05/06/2019: B Natriuretic Peptide 490.1; BUN 18; Creatinine, Ser 0.96; Hemoglobin 13.6; Platelets 177; Potassium 3.8; Sodium 137 06/20/2019: TSH 1.330  Recent Lipid Panel    Component Value Date/Time   CHOL 193 06/20/2019 1452   TRIG 122 06/20/2019 1452   HDL 78 06/20/2019 1452   CHOLHDL 2.5 06/20/2019 1452   LDLCALC 94 06/20/2019 1452    Physical Exam:    VS:  BP 122/78   Pulse 73   Ht 5\' 7"  (1.702 m)   Wt 231 lb (104.8 kg)   SpO2 99%   BMI 36.18 kg/m     Wt Readings from Last 3 Encounters:  07/25/19 231 lb (104.8 kg)  06/20/19 229 lb (103.9 kg)  05/23/19 230 lb 6 oz (104.5 kg)     GEN:  Well nourished, well developed in no acute distress HEENT: Normal NECK: No JVD; No carotid bruits LYMPHATICS: No lymphadenopathy CARDIAC: RRR, no murmurs, rubs, gallops RESPIRATORY:  Clear to auscultation without rales, wheezing or rhonchi  ABDOMEN: Soft, non-tender, non-distended MUSCULOSKELETAL:  No edema; No deformity  SKIN: Warm and dry NEUROLOGIC:  Alert and oriented x 3 PSYCHIATRIC:  Normal affect   ASSESSMENT:    No diagnosis found. PLAN:    In order of problems listed above:  1. Chronic systolic heart failure: Ejection fraction 40 to 45%.  She has not had an ischemic work-up despite risk factors for ischemia.  She stopped smoking.   Will plan for CTA coronaries.  Sx are better, but given significant RF for CAD, we need to r/o ischemia.  I think CT Ais a better option for her than invasive cath at this point.  I stressed the  importance of BP control.  2. Hypertension: The current medical regimen is effective;  continue present plan and medications. 3. Diabetes: A1C 8.1.  Healthy diet.  Whole food, plant-based diet recommended.  Avoid processed foods.   Medication Adjustments/Labs and Tests Ordered: Current medicines are reviewed at length with the patient today.  Concerns regarding medicines are outlined above.  No orders of the defined types were placed in this encounter.  No orders of the defined types were placed in this encounter.   There are no Patient Instructions on file  for this visit.   Signed, Larae Grooms, MD  07/25/2019 2:54 PM    [facility]

## 2019-08-08 ENCOUNTER — Other Ambulatory Visit: Payer: Self-pay | Admitting: Family Medicine

## 2019-08-08 ENCOUNTER — Telehealth: Payer: Self-pay | Admitting: *Deleted

## 2019-08-08 DIAGNOSIS — I10 Essential (primary) hypertension: Secondary | ICD-10-CM

## 2019-08-08 MED ORDER — HYDROCHLOROTHIAZIDE 25 MG PO TABS: 25.0000 mg | ORAL_TABLET | Freq: Every day | ORAL | 3 refills | Status: DC

## 2019-08-08 NOTE — Telephone Encounter (Signed)
Refill completed.

## 2019-08-08 NOTE — Telephone Encounter (Signed)
Rx request for hydrochlorothiazide 25 mg. Pernell Lenoir Kennon Holter, CMA

## 2019-08-12 ENCOUNTER — Encounter: Payer: Self-pay | Admitting: Family Medicine

## 2019-08-15 ENCOUNTER — Other Ambulatory Visit: Payer: Self-pay | Admitting: *Deleted

## 2019-08-17 ENCOUNTER — Other Ambulatory Visit: Payer: Self-pay | Admitting: *Deleted

## 2019-08-29 DIAGNOSIS — H401132 Primary open-angle glaucoma, bilateral, moderate stage: Secondary | ICD-10-CM | POA: Diagnosis not present

## 2019-08-30 ENCOUNTER — Ambulatory Visit
Admission: RE | Admit: 2019-08-30 | Discharge: 2019-08-30 | Disposition: A | Discharge status: Home / Self Care | Payer: Medicare Other | Source: Ambulatory Visit | Attending: Family Medicine | Admitting: Family Medicine

## 2019-08-30 ENCOUNTER — Other Ambulatory Visit: Payer: Self-pay

## 2019-08-30 ENCOUNTER — Other Ambulatory Visit: Payer: Medicare Other

## 2019-08-30 DIAGNOSIS — I5022 Chronic systolic (congestive) heart failure: Secondary | ICD-10-CM

## 2019-08-30 DIAGNOSIS — Z01812 Encounter for preprocedural laboratory examination: Secondary | ICD-10-CM

## 2019-08-30 DIAGNOSIS — R0602 Shortness of breath: Secondary | ICD-10-CM | POA: Diagnosis not present

## 2019-08-30 DIAGNOSIS — E119 Type 2 diabetes mellitus without complications: Secondary | ICD-10-CM

## 2019-08-30 DIAGNOSIS — I10 Essential (primary) hypertension: Secondary | ICD-10-CM

## 2019-08-30 DIAGNOSIS — Z1231 Encounter for screening mammogram for malignant neoplasm of breast: Secondary | ICD-10-CM | POA: Diagnosis not present

## 2019-08-31 LAB — BASIC METABOLIC PANEL
BUN/Creatinine Ratio: 16 (ref 9–23)
BUN: 17 mg/dL (ref 6–24)
CO2: 27 mmol/L (ref 20–29)
Calcium: 9.9 mg/dL (ref 8.7–10.2)
Chloride: 103 mmol/L (ref 96–106)
Creatinine, Ser: 1.04 mg/dL — ABNORMAL HIGH (ref 0.57–1.00)
GFR calc Af Amer: 69 mL/min/{1.73_m2} (ref >59)
GFR calc non Af Amer: 60 mL/min/{1.73_m2} (ref >59)
Glucose: 194 mg/dL — ABNORMAL HIGH (ref 65–99)
Potassium: 3.7 mmol/L (ref 3.5–5.2)
Sodium: 146 mmol/L — ABNORMAL HIGH (ref 134–144)

## 2019-09-05 ENCOUNTER — Telehealth (HOSPITAL_COMMUNITY): Payer: Self-pay | Admitting: Emergency Medicine

## 2019-09-05 NOTE — Telephone Encounter (Signed)
Reaching out to patient to offer assistance regarding upcoming cardiac imaging study; pt verbalizes understanding of appt date/time, parking situation and where to check in, pre-test NPO status and medications ordered, and verified current allergies; name and call back number provided for further questions should they arise Marchia Bond RN Navigator Cardiac Imaging [place] [facility] [phone] office [phone] cell  Pt misplaced instructions for CCTA / AVS, reviewed instructions in entirety with patient as she wrote them down, recited them back to me for clarity.   Pt appreciated the phone call.  Clarise Cruz

## 2019-09-06 ENCOUNTER — Ambulatory Visit (HOSPITAL_COMMUNITY)
Admission: RE | Admit: 2019-09-06 | Discharge: 2019-09-06 | Disposition: A | Discharge status: Home / Self Care | Payer: Medicare Other | Source: Ambulatory Visit | Attending: Interventional Cardiology | Admitting: Interventional Cardiology

## 2019-09-06 ENCOUNTER — Other Ambulatory Visit: Payer: Self-pay

## 2019-09-06 DIAGNOSIS — E119 Type 2 diabetes mellitus without complications: Secondary | ICD-10-CM | POA: Diagnosis not present

## 2019-09-06 DIAGNOSIS — I5022 Chronic systolic (congestive) heart failure: Secondary | ICD-10-CM | POA: Insufficient documentation

## 2019-09-06 DIAGNOSIS — I10 Essential (primary) hypertension: Secondary | ICD-10-CM | POA: Insufficient documentation

## 2019-09-06 DIAGNOSIS — R0602 Shortness of breath: Secondary | ICD-10-CM | POA: Diagnosis not present

## 2019-09-06 IMAGING — CT CT HEART MORP W/ CTA COR W/ SCORE W/ CA W/CM &/OR W/O CM
3 of 8 series · 7 of 20 positions shown, 8 images · non-contrast
Comparison: None.
COMPARISON: None.

Addendum:
EXAM:
OVER-READ INTERPRETATION  CT CHEST

The following report is an over-read performed by radiologist Dr.
Fotika Eg [REDACTED] on 09/06/2019. This
over-read does not include interpretation of cardiac or coronary
anatomy or pathology. The coronary calcium score/coronary CTA
interpretation by the cardiologist is attached.
TECHNIQUE: The patient was scanned on a Phillips Force scanner.

[Series 7: best syst · axial · 0.39mm/px · z∈[-258,-146]mm · 3 of 281 slices shown, 4 images]
[im 1/281  vessel]
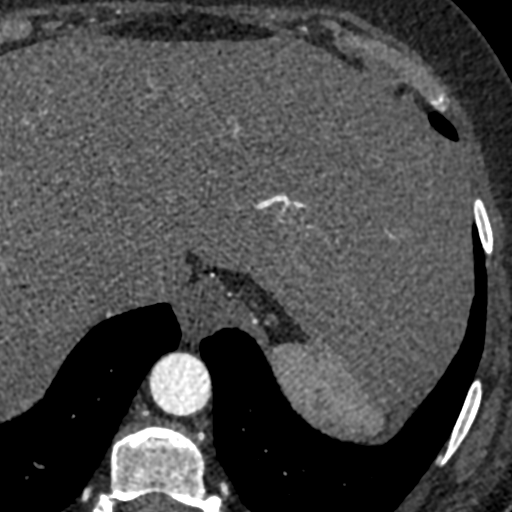
[im 1/281  lung]
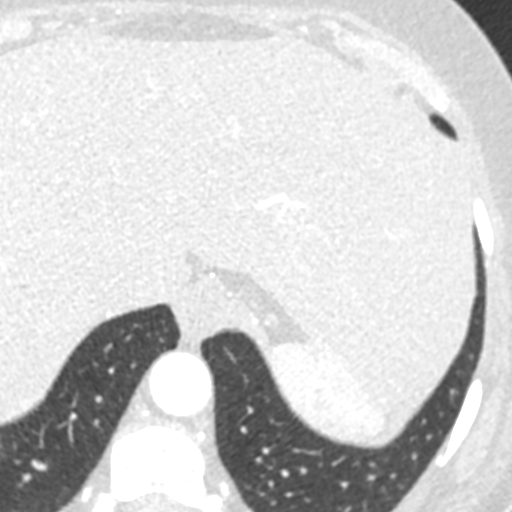
[im 141/281  vessel]
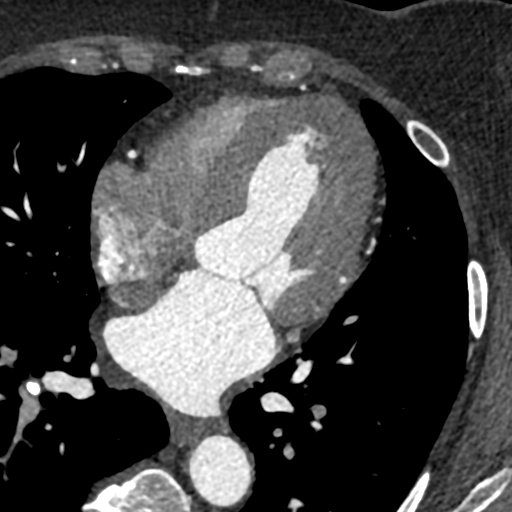
[im 281/281  vessel]
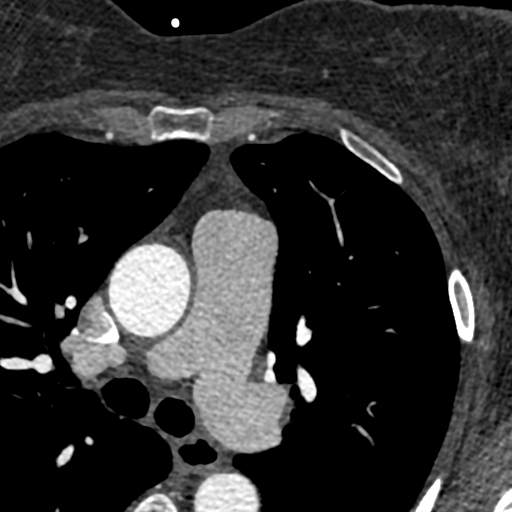

[Series 8: ts syst sharp · axial · 0.39mm/px · z∈[-221,-184]mm · 2 of 281 slices shown]
[im 94/281  lung]
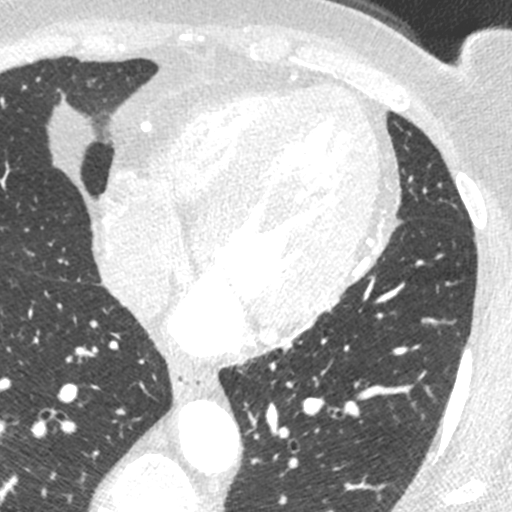
[im 187/281  lung]
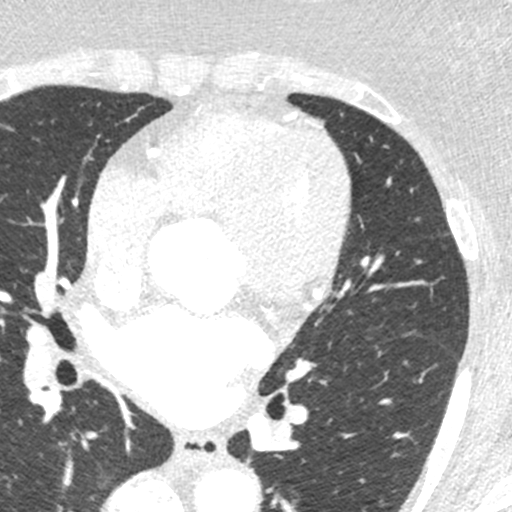

[Series 10: ts diast sharp · axial · 0.39mm/px · z∈[-221,-184]mm · 2 of 281 slices shown]
[im 94/281  lung]
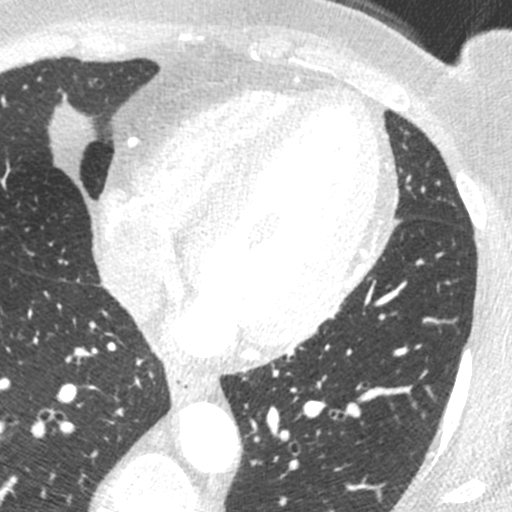
[im 187/281  lung]
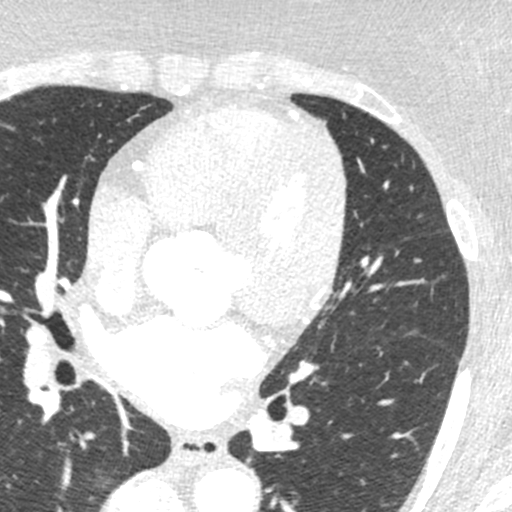

[7 of 20 positions shown; findings below may reference images not displayed]

FINDINGS: Calcified granuloma in the right lower lobe incidentally noted.
Within the visualized portions of the thorax there are no suspicious
appearing pulmonary nodules or masses, there is no acute
consolidative airspace disease, no pleural effusions, no
pneumothorax and no lymphadenopathy. Visualized portions of the
upper abdomen are unremarkable. There are no aggressive appearing
lytic or blastic lesions noted in the visualized portions of the
skeleton.
IMPRESSION: No significant incidental noncardiac findings are noted.

EXAM:
Cardiac/Coronary  CT
FINDINGS: A 120 kV prospective scan was triggered in the descending thoracic
aorta at 111 HU's. Axial non-contrast 3 mm slices were carried out
through the heart. The data set was analyzed on a dedicated work
station and scored using the Agatson method. Gantry rotation speed
was 250 msecs and collimation was .6 mm. No beta blockade and 0.8 mg
of sl NTG was given. The 3D data set was reconstructed in 5%
intervals of the 67-82 % of the R-R cycle. Diastolic phases were
analyzed on a dedicated work station using MPR, MIP and VRT modes.
The patient received 80 cc of contrast.

Aorta:  Normal size.  No calcifications.  No dissection.

Aortic Valve:  Trileaflet.  No calcifications.

Coronary Arteries:  Normal coronary origin.  Right dominance.

RCA is a large dominant artery that gives rise to PDA and PLVB.
There is no plaque.

Left main is a large artery that gives rise to LAD, Ramus and LCX
arteries. There is no plaque.

LAD is a large vessel that gives rise to a small diagonal. There is
no calcified plaque in the mid to distal LAD but cannot rule out non
calcified plaque due to motion artifact.

Ramus is a large vessel that has no plaque.

LCX is a non-dominant artery that gives rise to one large OM1
branch. There is no calcified plaque in the LCx. There is motion
artifact in the mid Lcx and mid OM at the bifurcation of the
superior and inferior branches. There is no calcified plaque but
cannot rule out noncalcified plaque.

Other findings:

Normal pulmonary vein drainage into the left atrium.

Normal let atrial appendage without a thrombus.

Normal size of the pulmonary artery.
IMPRESSION: 1. Coronary calcium score of 0. This was 0 percentile for age and
sex matched control.

2.  Normal coronary origin with right dominance.

3. No evidence of calcified plaque. Cannot rule out non calcified
plaque due to motion artifact in the mid LAD and mid LCx. CAD-RADS
0.

4.  Consider other non atherosclerotic causes of chest pain.

5. This study has been sent for FFR analysis due to inability to
assess mid LCx and LAD.

Jean Hernst Pieriche

*** End of Addendum ***
EXAM:
OVER-READ INTERPRETATION  CT CHEST

The following report is an over-read performed by radiologist Dr.
Fotika Eg [REDACTED] on 09/06/2019. This
over-read does not include interpretation of cardiac or coronary
anatomy or pathology. The coronary calcium score/coronary CTA
interpretation by the cardiologist is attached.
FINDINGS: Calcified granuloma in the right lower lobe incidentally noted.
Within the visualized portions of the thorax there are no suspicious
appearing pulmonary nodules or masses, there is no acute
consolidative airspace disease, no pleural effusions, no
pneumothorax and no lymphadenopathy. Visualized portions of the
upper abdomen are unremarkable. There are no aggressive appearing
lytic or blastic lesions noted in the visualized portions of the
skeleton.
IMPRESSION: No significant incidental noncardiac findings are noted.

## 2019-09-06 MED ORDER — METOPROLOL TARTRATE 5 MG/5ML IV SOLN: 5.0000 mg | INTRAVENOUS | Status: DC | PRN

## 2019-09-06 MED ORDER — IOHEXOL 350 MG/ML SOLN
80.0000 mL | Freq: Once | INTRAVENOUS | Status: AC | PRN
  Administered 2019-09-06: 80 mL via INTRAVENOUS

## 2019-09-06 MED ORDER — NITROGLYCERIN 0.4 MG SL SUBL
SUBLINGUAL_TABLET | SUBLINGUAL | Status: AC
  Filled 2019-09-06: qty 2

## 2019-09-06 MED ORDER — NITROGLYCERIN 0.4 MG SL SUBL
0.8000 mg | SUBLINGUAL_TABLET | Freq: Once | SUBLINGUAL | Status: AC
  Administered 2019-09-06: 0.8 mg via SUBLINGUAL

## 2019-09-13 ENCOUNTER — Other Ambulatory Visit: Payer: Self-pay | Admitting: Family Medicine

## 2019-09-14 ENCOUNTER — Ambulatory Visit (HOSPITAL_COMMUNITY)
Admission: RE | Admit: 2019-09-14 | Discharge: 2019-09-14 | Disposition: A | Discharge status: Home / Self Care | Payer: Medicare Other | Source: Ambulatory Visit | Attending: Interventional Cardiology | Admitting: Interventional Cardiology

## 2019-09-14 DIAGNOSIS — R0602 Shortness of breath: Secondary | ICD-10-CM

## 2019-09-14 DIAGNOSIS — I10 Essential (primary) hypertension: Secondary | ICD-10-CM

## 2019-09-14 DIAGNOSIS — I5022 Chronic systolic (congestive) heart failure: Secondary | ICD-10-CM

## 2019-09-14 DIAGNOSIS — E119 Type 2 diabetes mellitus without complications: Secondary | ICD-10-CM

## 2019-09-19 ENCOUNTER — Telehealth: Payer: Self-pay

## 2019-09-19 NOTE — Telephone Encounter (Signed)
-----   Message from [place] Pink, [place] sent at 09/19/2019 11:32 AM EDT ----- Regarding: cologuard This patient's cologuard order is set to expire soon. Please let her know she needs to complete this and return it to exact science lab soon.  Thanks!

## 2019-09-19 NOTE — Telephone Encounter (Signed)
Spoke to patient as there is a discrepancy between her prescription and what she is taking.  Her prescription states that she is possibly taking 12.5 mg of metoprolol QD.  She notes she is taking 25 mg QD. Highly recommended patient contact her cardiologist to determine the correct dose.  I will not refill provide further refills until she does this.

## 2019-09-19 NOTE — Telephone Encounter (Signed)
Attempted to call patient concerning upcoming expiring  Cologuard test.  There was nos answer and no ability to leave a message.  Ozella Almond, [place]

## 2019-09-20 ENCOUNTER — Telehealth: Payer: Self-pay

## 2019-09-20 NOTE — Telephone Encounter (Signed)
-----   Message from [place] Pink, [place] sent at 09/19/2019 11:32 AM EDT ----- Regarding: cologuard This patient's cologuard order is set to expire soon. Please let her know she needs to complete this and return it to exact science lab soon.  Thanks!

## 2019-09-20 NOTE — Telephone Encounter (Signed)
Spoke to patient and she had forgotten to complete the test but will complete and send it in.    Patient appreciated the reminder call.  Tara Jarvis, [place]

## 2019-10-07 ENCOUNTER — Ambulatory Visit (HOSPITAL_COMMUNITY)
Admission: RE | Admit: 2019-10-07 | Discharge: 2019-10-07 | Disposition: A | Discharge status: Home / Self Care | Payer: Medicare Other | Source: Ambulatory Visit | Attending: Interventional Cardiology | Admitting: Interventional Cardiology

## 2019-10-07 DIAGNOSIS — R0602 Shortness of breath: Secondary | ICD-10-CM | POA: Diagnosis not present

## 2019-10-07 DIAGNOSIS — Z6836 Body mass index (BMI) 36.0-36.9, adult: Secondary | ICD-10-CM

## 2019-10-07 DIAGNOSIS — R079 Chest pain, unspecified: Secondary | ICD-10-CM | POA: Diagnosis not present

## 2019-10-07 DIAGNOSIS — I5022 Chronic systolic (congestive) heart failure: Secondary | ICD-10-CM

## 2019-10-07 DIAGNOSIS — I10 Essential (primary) hypertension: Secondary | ICD-10-CM

## 2019-10-07 DIAGNOSIS — R931 Abnormal findings on diagnostic imaging of heart and coronary circulation: Secondary | ICD-10-CM | POA: Diagnosis not present

## 2019-10-07 DIAGNOSIS — I110 Hypertensive heart disease with heart failure: Secondary | ICD-10-CM | POA: Insufficient documentation

## 2019-10-07 DIAGNOSIS — E119 Type 2 diabetes mellitus without complications: Secondary | ICD-10-CM | POA: Diagnosis not present

## 2019-10-28 ENCOUNTER — Telehealth: Payer: Self-pay

## 2019-10-28 NOTE — Telephone Encounter (Signed)
-----   Message from [place] Pink, [place] sent at 10/28/2019  9:26 AM EDT ----- Regarding: Cologuard Cologuard due

## 2019-10-28 NOTE — Telephone Encounter (Signed)
Called and LVM for patient to call office.  .Tylee Newby R Sharetha Newson, CMA  

## 2019-11-10 NOTE — Telephone Encounter (Signed)
Patient returns call stating she has no interest in completing Cologuard.

## 2019-11-14 DIAGNOSIS — H401132 Primary open-angle glaucoma, bilateral, moderate stage: Secondary | ICD-10-CM | POA: Diagnosis not present

## 2019-12-12 ENCOUNTER — Other Ambulatory Visit: Payer: Self-pay | Admitting: Family Medicine

## 2019-12-12 DIAGNOSIS — E118 Type 2 diabetes mellitus with unspecified complications: Secondary | ICD-10-CM

## 2020-02-14 DIAGNOSIS — H04123 Dry eye syndrome of bilateral lacrimal glands: Secondary | ICD-10-CM | POA: Diagnosis not present

## 2020-02-14 DIAGNOSIS — H25813 Combined forms of age-related cataract, bilateral: Secondary | ICD-10-CM | POA: Diagnosis not present

## 2020-02-14 DIAGNOSIS — E119 Type 2 diabetes mellitus without complications: Secondary | ICD-10-CM | POA: Diagnosis not present

## 2020-02-14 DIAGNOSIS — H401132 Primary open-angle glaucoma, bilateral, moderate stage: Secondary | ICD-10-CM | POA: Diagnosis not present

## 2020-03-05 ENCOUNTER — Ambulatory Visit: Payer: Medicare Other | Admitting: Family Medicine

## 2020-03-05 ENCOUNTER — Other Ambulatory Visit: Payer: Self-pay

## 2020-03-05 NOTE — Progress Notes (Deleted)
   Subjective:   Patient ID: Tara Jarvis    DOB: 20-May-1961, 59 y.o. female   MRN: [id number]  Tara Jarvis is a 59 y.o. female with a history of HFrEF, HTN, T2DM, HLD, tobacco use here for check up.  Diabetes: Last three A1C's below. Currently on Jardiance 10mg  QD, Metformin 500QD. Endorses compliance. Notes CBGs range ***. Denies any hypoglycemia. Denies any polyuria, polydipsia, polyphagia. Due for diabetic eye exam.  Lab Results  Component Value Date   HGBA1C 8.1 (A) 06/20/2019   HGBA1C 7.6 (A) 03/21/2019   HGBA1C 7.7 (A) 08/18/2018    HTN:    today. Currently on HCTZ 25mg  QD and Metoprolol 12.5mg  QD. Endorses compliance. Non-smoker/Current everyday smoker***. Denies any chest pain, SOB, vision changes, or headaches.    HLD: Last lipid panel below. Currently on Atorvastatin 40mg QD. Endorses compliance. Denies any muscles aches or weakness.  Lab Results  Component Value Date   CHOL 193 06/20/2019   HDL 78 06/20/2019   LDLCALC 94 06/20/2019   TRIG 122 06/20/2019   CHOLHDL 2.5 06/20/2019   Health Maintenance: Due for COVID vaccine, colon cancer screen, flu, diabetic eye exam  Review of Systems:  Per HPI.   Objective:   There were no vitals taken for this visit. Vitals and nursing note reviewed.  General: pleasant ***, sitting comfortably in exam chair, well nourished, well developed, in no acute distress with non-toxic appearance HEENT: normocephalic, atraumatic, moist mucous membranes, oropharynx clear without erythema or exudate, TM normal bilaterally  Neck: supple, non-tender without lymphadenopathy CV: regular rate and rhythm without murmurs, rubs, or gallops, no lower extremity edema, 2+ radial and pedal pulses bilaterally Lungs: clear to auscultation bilaterally with normal work of breathing on room air Resp: breathing comfortably on room air, speaking in full sentences Abdomen: soft, non-tender, non-distended, no masses or organomegaly palpable,  normoactive bowel sounds Skin: warm, dry, no rashes or lesions Extremities: warm and well perfused, normal tone MSK: ROM grossly intact, strength intact, gait normal Neuro: Alert and oriented, speech normal  Assessment & Plan:   No problem-specific Assessment & Plan notes found for this encounter.  No orders of the defined types were placed in this encounter.  No orders of the defined types were placed in this encounter.   Mina Marble, DO PGY-3, [facility] 03/05/2020 9:18 AM

## 2020-03-15 ENCOUNTER — Ambulatory Visit: Payer: Medicare Other | Admitting: Family Medicine

## 2020-03-15 NOTE — Clinical Note (Incomplete)
    SUBJECTIVE:   CHIEF COMPLAINT / HPI:   Diabetes Current Regimen: *** CBGs: ***  Last A1c: *** on ***  Denies polyuria, polydipsia, hypoglycemia *** Last Eye Exam: *** Statin: *** ACE/ARB: ***   HTN -go over bp -what meds she's on    -repeat bmp (creatinine elevated in march   Nutrition -would she like to see nutrition?  HM Colon cancer screening? COVID vaccine? Flu vaccine?  PERTINENT  PMH / PSH: ***  OBJECTIVE:   There were no vitals taken for this visit.  ***  ASSESSMENT/PLAN:   No problem-specific Assessment & Plan notes found for this encounter.     [place]

## 2020-03-20 ENCOUNTER — Other Ambulatory Visit: Payer: Self-pay

## 2020-03-20 ENCOUNTER — Ambulatory Visit (INDEPENDENT_AMBULATORY_CARE_PROVIDER_SITE_OTHER): Payer: Medicare Other | Admitting: Family Medicine

## 2020-03-20 DIAGNOSIS — B349 Viral infection, unspecified: Secondary | ICD-10-CM | POA: Insufficient documentation

## 2020-03-20 NOTE — Progress Notes (Signed)
    SUBJECTIVE:   CHIEF COMPLAINT / HPI:   Anosmia, congestion Patient presenting today with 4 days of dry cough, posttussive gagging, chills after recent travel to [facility].  Patient reports that she is Covid vaccinated.  She is a current smoker.  Patient denies any trouble breathing and no baseline cough.  Patient reports trying Halls at home to help with the cough with no improvement.  She is very concerned about her anosmia and is hoping to get her smell back soon.  PERTINENT  PMH / PSH: Current smoker, HFrEF, high primary hypertension  OBJECTIVE:   BP 118/84   Pulse (!) 58   SpO2 98%   General: Well-appearing female, no acute distress HEENT: Bilateral congestion of nasal turbinates, oropharynx clear with no exudates Respiratory: No respiratory distress.  Speaking in full sentences  ASSESSMENT/PLAN:   Viral illness Patient provided return precautions to the emergency department.  Her symptoms are consistent with COVID-19.  She has an appointment with [facility] tomorrow for Covid testing.  In the meantime, have suggested that patient continue to isolate and recommended sinus rinse to help with congestion.Wilber Oliphant, MD [place]

## 2020-03-20 NOTE — Assessment & Plan Note (Addendum)
Patient provided return precautions to the emergency department.  Her symptoms are consistent with COVID-19.  She has an appointment with [facility] tomorrow for Covid testing.  In the meantime, have suggested that patient continue to isolate and recommended sinus rinse to help with congestion.Marland Kitchen

## 2020-03-20 NOTE — Patient Instructions (Signed)
How to Perform a Sinus Rinse A sinus rinse is a home treatment. It rinses your sinuses with a mixture of salt and water (saline solution). Sinuses are air-filled spaces in your skull behind the bones of your face and forehead. They open into your nasal cavity. A sinus rinse can help to clear your nasal cavity. It can clear mucus, dirt, dust, or pollen. You may do a sinus rinse when you have:  A cold.  A virus.  Allergies.  A sinus infection.  A stuffy nose. Talk with your doctor about whether a sinus rinse might help you. What are the risks? A sinus rinse is normally very safe and helpful. However, there are a few risks. These include:  A burning feeling in the sinuses. This may happen if you do not make the saline solution as instructed. Be sure to follow all directions when making the saline solution.  Nasal irritation.  Infection from unclean water. This is rare, but possible. Do not do a sinus rinse if you have had:  Ear or nasal surgery.  An ear infection.  Blocked ears. Supplies needed:  Saline solution or powder.  Distilled or germ-free (sterile) water may be needed to mix with saline powder. ? You may use boiled and cooled tap water. Boil tap water for 5 minutes; cool until it is lukewarm. Use within 24 hours. ? Do not use regular tap water to mix with the saline solution.  Neti pot or nasal rinse bottle. This releases the saline solution into your nose and through your sinuses. You can buy neti pots and rinse bottles: ? At your local pharmacy. ? At a health food store. ? Online. How to perform a sinus rinse  1. Wash your hands with soap and water. 2. Wash your device using the directions that came with it. 3. Dry your device. 4. Use the solution that comes with your device or one that is sold separately in stores. Follow the mixing directions on the package if you need to mix with sterile or distilled water. 5. Fill your device with the amount of saline  solution stated in the device instructions. 6. Stand over a sink and tilt your head sideways over the sink. 7. Place the spout of the device in your upper nostril (the one closer to the ceiling). 8. Gently pour or squeeze the saline solution into your nasal cavity. The liquid should drain to your lower nostril if you are not too stuffed up (congested). 9. While rinsing, breathe through your open mouth. 10. Gently blow your nose to clear any mucus and rinse solution. Blowing too hard may cause ear pain. 11. Repeat in your other nostril. 12. Clean and rinse your device with clean water. 13. Air-dry your device. Talk with your doctor or pharmacist if you have questions about how to do a sinus rinse. Summary  A sinus rinse is a home treatment. It rinses your sinuses with a mixture of salt and water (saline solution).  A sinus rinse is normally very safe and helpful. Follow all instructions carefully.  Talk with your doctor about whether a sinus rinse might help you. This information is not intended to replace advice given to you by your health care provider. Make sure you discuss any questions you have with your health care provider. Document Revised: 04/06/2017 Document Reviewed: 04/06/2017 Elsevier Patient Education  2020 [employer].

## 2020-03-22 ENCOUNTER — Other Ambulatory Visit: Payer: Medicare Other

## 2020-03-22 DIAGNOSIS — Z20822 Contact with and (suspected) exposure to covid-19: Secondary | ICD-10-CM | POA: Diagnosis not present

## 2020-03-23 ENCOUNTER — Ambulatory Visit: Payer: Medicare Other

## 2020-03-23 ENCOUNTER — Telehealth: Payer: Self-pay | Admitting: Family Medicine

## 2020-03-23 LAB — NOVEL CORONAVIRUS, NAA: SARS-CoV-2, NAA: DETECTED — AB

## 2020-03-23 LAB — SARS-COV-2, NAA 2 DAY TAT

## 2020-03-23 NOTE — Telephone Encounter (Signed)
Patient calling to let PCP know she has tested positive for COVID. Please call patient with any concerns.

## 2020-03-24 ENCOUNTER — Other Ambulatory Visit: Payer: Self-pay | Admitting: Physician Assistant

## 2020-03-24 DIAGNOSIS — I10 Essential (primary) hypertension: Secondary | ICD-10-CM

## 2020-03-24 DIAGNOSIS — U071 COVID-19: Secondary | ICD-10-CM

## 2020-03-24 DIAGNOSIS — E119 Type 2 diabetes mellitus without complications: Secondary | ICD-10-CM

## 2020-03-24 NOTE — Progress Notes (Signed)
I connected by phone with [name] on 03/24/2020 at 8:56 AM to discuss the potential use of a new treatment for mild to moderate COVID-19 viral infection in non-hospitalized patients.  This patient is a 59 y.o. female that meets the FDA criteria for Emergency Use Authorization of COVID monoclonal antibody casirivimab/imdevimab or bamlanivimab/eteseviamb.  Has a (+) direct SARS-CoV-2 viral test result  Has mild or moderate COVID-19   Is NOT hospitalized due to COVID-19  Is within 10 days of symptom onset  Has at least one of the high risk factor(s) for progression to severe COVID-19 and/or hospitalization as defined in EUA.  Specific high risk criteria : BMI > 25, Diabetes, Cardiovascular disease or hypertension and Other high risk medical condition per CDC:  SVI   I have spoken and communicated the following to the patient or parent/caregiver regarding COVID monoclonal antibody treatment:  1. FDA has authorized the emergency use for the treatment of mild to moderate COVID-19 in adults and pediatric patients with positive results of direct SARS-CoV-2 viral testing who are 60 years of age and older weighing at least 40 kg, and who are at high risk for progressing to severe COVID-19 and/or hospitalization.  2. The significant known and potential risks and benefits of COVID monoclonal antibody, and the extent to which such potential risks and benefits are unknown.  3. Information on available alternative treatments and the risks and benefits of those alternatives, including clinical trials.  4. Patients treated with COVID monoclonal antibody should continue to self-isolate and use infection control measures (e.g., wear mask, isolate, social distance, avoid sharing personal items, clean and disinfect "high touch" surfaces, and frequent handwashing) according to CDC guidelines.   5. The patient or parent/caregiver has the option to accept or refuse COVID monoclonal antibody  treatment.  After reviewing this information with the patient, the patient has agreed to receive one of the available covid 19 monoclonal antibodies and will be provided an appropriate fact sheet prior to infusion. Tami Lin Helmut [place], [place] 03/24/2020 8:56 AM

## 2020-03-25 ENCOUNTER — Ambulatory Visit (HOSPITAL_COMMUNITY)
Admission: RE | Admit: 2020-03-25 | Discharge: 2020-03-25 | Disposition: A | Discharge status: Home / Self Care | Payer: Medicare Other | Source: Ambulatory Visit | Attending: Pulmonary Disease | Admitting: Pulmonary Disease

## 2020-03-25 DIAGNOSIS — I10 Essential (primary) hypertension: Secondary | ICD-10-CM | POA: Diagnosis present

## 2020-03-25 DIAGNOSIS — U071 COVID-19: Secondary | ICD-10-CM | POA: Insufficient documentation

## 2020-03-25 DIAGNOSIS — E119 Type 2 diabetes mellitus without complications: Secondary | ICD-10-CM | POA: Insufficient documentation

## 2020-03-25 DIAGNOSIS — Z23 Encounter for immunization: Secondary | ICD-10-CM | POA: Insufficient documentation

## 2020-03-25 MED ORDER — SODIUM CHLORIDE 0.9 % IV SOLN: INTRAVENOUS | Status: DC | PRN

## 2020-03-25 MED ORDER — DIPHENHYDRAMINE HCL 50 MG/ML IJ SOLN: 50.0000 mg | Freq: Once | INTRAMUSCULAR | Status: DC | PRN

## 2020-03-25 MED ORDER — FAMOTIDINE IN NACL 20-0.9 MG/50ML-% IV SOLN: 20.0000 mg | Freq: Once | INTRAVENOUS | Status: DC | PRN

## 2020-03-25 MED ORDER — EPINEPHRINE 0.3 MG/0.3ML IJ SOAJ: 0.3000 mg | Freq: Once | INTRAMUSCULAR | Status: DC | PRN

## 2020-03-25 MED ORDER — ALBUTEROL SULFATE HFA 108 (90 BASE) MCG/ACT IN AERS: 2.0000 | INHALATION_SPRAY | Freq: Once | RESPIRATORY_TRACT | Status: DC | PRN

## 2020-03-25 MED ORDER — SODIUM CHLORIDE 0.9 % IV SOLN
1200.0000 mg | Freq: Once | INTRAVENOUS | Status: AC
  Administered 2020-03-25: 1200 mg via INTRAVENOUS

## 2020-03-25 MED ORDER — METHYLPREDNISOLONE SODIUM SUCC 125 MG IJ SOLR: 125.0000 mg | Freq: Once | INTRAMUSCULAR | Status: DC | PRN

## 2020-03-25 NOTE — Progress Notes (Signed)
  Diagnosis: COVID-19  Physician: Dr. Joya Gaskins   Procedure: [facility] Med: casirivimab\imdevimab infusion - Provided patient with casirivimab\imdevimab fact sheet for patients, parents and caregivers prior to infusion.  Complications: No immediate complications noted.  Discharge: Discharged home   Tara Jarvis 03/25/2020

## 2020-03-25 NOTE — Discharge Instructions (Signed)

## 2020-03-27 NOTE — Telephone Encounter (Signed)
Called patient to touch base given her recent diagnosis of COVID-19. She notes that she is doing well. She has low energy but her appetite has returned and she denies any SOB or CP. She notes that is is drinking plenty of fluids. Recommended she follow up with me once out of isolation. Discussed ED precautions. She voiced understanding and agreement with plan.

## 2020-03-30 DIAGNOSIS — Z20822 Contact with and (suspected) exposure to covid-19: Secondary | ICD-10-CM | POA: Diagnosis not present

## 2020-05-01 ENCOUNTER — Other Ambulatory Visit: Payer: Self-pay | Admitting: Family Medicine

## 2020-05-01 DIAGNOSIS — E118 Type 2 diabetes mellitus with unspecified complications: Secondary | ICD-10-CM

## 2020-05-03 ENCOUNTER — Ambulatory Visit (INDEPENDENT_AMBULATORY_CARE_PROVIDER_SITE_OTHER): Payer: Medicare Other | Admitting: Podiatry

## 2020-05-03 ENCOUNTER — Encounter: Payer: Self-pay | Admitting: Podiatry

## 2020-05-03 ENCOUNTER — Ambulatory Visit (INDEPENDENT_AMBULATORY_CARE_PROVIDER_SITE_OTHER): Payer: Medicare Other

## 2020-05-03 ENCOUNTER — Other Ambulatory Visit: Payer: Self-pay

## 2020-05-03 DIAGNOSIS — R52 Pain, unspecified: Secondary | ICD-10-CM

## 2020-05-03 DIAGNOSIS — M779 Enthesopathy, unspecified: Secondary | ICD-10-CM

## 2020-05-03 DIAGNOSIS — G5791 Unspecified mononeuropathy of right lower limb: Secondary | ICD-10-CM | POA: Diagnosis not present

## 2020-05-03 DIAGNOSIS — G5792 Unspecified mononeuropathy of left lower limb: Secondary | ICD-10-CM | POA: Diagnosis not present

## 2020-05-03 DIAGNOSIS — G629 Polyneuropathy, unspecified: Secondary | ICD-10-CM

## 2020-05-03 MED ORDER — GABAPENTIN 300 MG PO CAPS: 300.0000 mg | ORAL_CAPSULE | Freq: Three times a day (TID) | ORAL | 3 refills | Status: DC

## 2020-05-07 ENCOUNTER — Other Ambulatory Visit: Payer: Self-pay | Admitting: *Deleted

## 2020-05-07 DIAGNOSIS — E118 Type 2 diabetes mellitus with unspecified complications: Secondary | ICD-10-CM

## 2020-05-07 DIAGNOSIS — I10 Essential (primary) hypertension: Secondary | ICD-10-CM

## 2020-05-07 MED ORDER — ATORVASTATIN CALCIUM 40 MG PO TABS: 40.0000 mg | ORAL_TABLET | Freq: Every day | ORAL | 3 refills | Status: DC

## 2020-05-07 NOTE — Progress Notes (Signed)
Subjective:   Patient ID: Tara Jarvis, female   DOB: 59 y.o.   MRN: [id number]   HPI Patient presents stating she gets burning pain it seems to be more in the right foot than the left foot and is ongoing.  Does not remember when it started but it has been a while and does not seem to be improving currently.  Patient does not smoke likes to be active   Review of Systems  All other systems reviewed and are negative.       Objective:  Physical Exam Vitals and nursing note reviewed.  Constitutional:      Appearance: She is well-developed.  Pulmonary:     Effort: Pulmonary effort is normal.  Musculoskeletal:        General: Normal range of motion.  Skin:    General: Skin is warm.  Neurological:     Mental Status: She is alert.     Neurovascular status found to be intact muscle strength was found to be adequate patient is a long-term diabetic that is currently controlled and does have some issues with heart issues and has a issue with tobacco.  Patient does have discomfort more on the right foot than left foot localized and also appears to have neuropathic changes with diminished sharp dull vibratory      Assessment:  Difficult to say as to what is the exact process here but appears to be inflammatory with the possibility that we may be dealing with some neuropathic-like symptomatology     Plan:  H&P x-rays reviewed today I injected around the fifth MPJ where the pain seems to be center 3 mg Dexasone Kenalog 5 mg Xylocaine and I advised this patient on medicine to try to control the burning tingling she experiences.  Patient is written a prescription for gabapentin 300 mg to start at night and then if tolerated well to take 1 in the morning possible 1 midday.  Reappoint to recheck  X-rays indicate there is moderate arthritis no indications of stress fracture or advanced arthritis or arch issues

## 2020-05-21 DIAGNOSIS — H401132 Primary open-angle glaucoma, bilateral, moderate stage: Secondary | ICD-10-CM | POA: Diagnosis not present

## 2020-06-25 ENCOUNTER — Other Ambulatory Visit: Payer: Self-pay | Admitting: Family Medicine

## 2020-07-11 ENCOUNTER — Ambulatory Visit: Payer: Medicare Other | Admitting: Family Medicine

## 2020-07-16 NOTE — Progress Notes (Signed)
SUBJECTIVE:   Chief compliant/HPI: annual examination  Tara Jarvis is a 60 y.o. who presents today for an annual exam.   PMH: HFrEF (EF 40-45%)), HTN, diabetes, HLD, dyspnea on exertion, tobacco use disorder  Diabetes: Last three A1C's below. Currently on Metformin 527m QD and Jardiance 154mQD. Endorses compliance. Denies any polyuria, polydipsia, polyphagia. Due for diabetic foot exam and diabetic eye exam.  Lab Results  Component Value Date   HGBA1C 10.3 (A) 07/17/2020   HGBA1C 8.1 (A) 06/20/2019   HGBA1C 7.6 (A) 03/21/2019    HTN:  BP: 126/90 today. Currently on HCTZ 2537mD and Metoprolol 12.5mg41m. Endorses compliance. Current everyday smoker. Denies any chest pain, SOB, vision changes, or headaches.    HLD: Last lipid panel below. Currently on Atorvastatin 40mg30m Endorses compliance. Denies any muscles aches or weakness. The 10-year ASCVD risk score (GoffMikey Bussingr., et al., 2013) is: 18.9%   Lab Results  Component Value Date   CHOL 193 06/20/2019   HDL 78 06/20/2019   LDLCALC 94 06/20/2019   TRIG 122 06/20/2019   CHOLHDL 2.5 06/20/2019   Tobacco use: History of smoking 1/4 pack/day x 10 years (3 pack year history). Currently smokes 3-4 cigarettes a day. Actively trying to cut back. Not interested   Social History: LMP: Postmenopausal Alcohol: occasional Tobacco: 3-4 cigarettes per day. History of 5 cigarettes/day x 10 years. Illicit Drugs: no Safe at home: yes Depression/Suicidality: no   Health Maintenance: Health Maintenance Due  Topic  . Fecal DNA (Cologuard)   . INFLUENZA VACCINE   . OPHTHALMOLOGY EXAM   . URINE MICROALBUMIN    No family history of breast or colon cancer to suggest early screening.  Review of systems: see HPI  OBJECTIVE:   BP 126/90   Pulse 96   Ht 5' 7"  (1.702 m)   Wt 226 lb (102.5 kg)   SpO2 98%   BMI 35.40 kg/m   General: pleasant older female, sitting comfortably in exam chair, well nourished, well developed, in  no acute distress with non-toxic appearance HEENT: normocephalic, atraumatic, moist mucous membranes, oropharynx clear without erythema or exudate, dental cares present  CV: regular rate and rhythm without murmurs, rubs, or gallops, no lower extremity edema, 2+ radial and pedal pulses bilaterally Lungs: clear to auscultation bilaterally with normal work of breathing on room air, speaking in full sentences Skin: warm, dry, Extremities: warm and well perfused, normal tone MSK: gait normal Neuro: Alert and oriented, speech normal  Diabetic foot exam was performed with the following findings:   No deformities, ulcerations, or other skin breakdown Normal sensation of 10g monofilament Intact posterior tibialis and dorsalis pedis pulses    ASSESSMENT/PLAN:   Annual Physical Exam: Patient here today for annual physical exam.  PMH, surgical history, and social history were reviewed. The following concerns below were discussed.   Primary hypertension Chronic, well controlled. - Continue HCTZ 25mg 30mnd Metoprolol 12.5mg QD60mHistory of anaphylaxis on Lisinopril.   Hyperlipidemia associated with type 2 diabetes mellitus (HCC) Chronic. Endorses compliance. Lipid panel today. Continue Atorvastatin 40mg QD50m worsening, plan to increase to 80mg QD 28macco use disorder History of smoking 1/4 pack/day x 10 years (3 pack year history). Currently smokes 3-4 cigarettes a day. Actively trying to cut back. Does not qualify for lung cancer screen at this time. Patient not intestered in cessation supplies at this time - she is working on this herself. Patient was counseled on the risks of  tobacco use and cessation strongly encouraged. Encouraged to call if desires assistance.   Uncontrolled type 2 diabetes mellitus (HCC) Chronic, uncontrolled. Worsening A1C from 8.1 to 10.3. GI side effects with higher doses of Metformin.  Discussed management options. Patient would likely benefit from both increase  in SGLT-2 and addition of GLP-1, however with shared decision making will start with increase in SGLT-2, follow up in 1 month. Have patient keep log of blood sugars. If remains elevated >180, plan to start GLP-1 (Trulicity 0.96GE qweekly x 4 weeks with plan to increase if tolerating). Patient opted and agreed to this plan. - Increase Jardiance 76m QD - Continue Metformin 5048mQD - Follow up 08/20/20, patient instructed to bring sugar log - diabetes supplies sent to pharmacy - If still uncontrolled plan to start Trulicty 0.3.66QHweekly. Follow up in 4 weeks to assess tolerability. Plan to increase to 1.65m37mf tolerating x 4 weeks. If better glycemic control is needed will plan to continue to increase to 3mg1m4 weeks, then 4.65mg 16m weeks if tolerating and if indicated.  Annual Examination  See AVS for age appropriate recommendations  PHQ score 7, reviewed and discussed.  BP reviewed and at goal.  Asked about intimate partner violence and denied  Considered the following items based upon USPSTF recommendations: HIV testing: Nonreactive 09/2017. Not at high risk Hepatitis C: negative 09/2017. Not at high risk. Hepatitis B: ordered Syphilis if at high risk: not at high risk, not ordered GC/CT not at high risk and not ordered. Osteoporosis screening considered based upon risk of fracture [facility]. Major osteoporotic fracture risk is 5.4%. DEXA not ordered.  Reviewed risk factors for latent tuberculosis and not indicated   Discussed family history, BRCA testing not indicated. No family history Cervical cancer screening: prior Pap reviewed, repeat due in 08/2021 Breast cancer screening: Up to date. Due for repeat in 08/2021 Colorectal cancer screening: discussed options, elected for cologuard Lung cancer screening: discussed and No indicated. See documentation below regarding indications/risks/benefits.  Vaccinations Flu shot given today.   Follow up in 1 month for diabetes follow  up   [place]

## 2020-07-17 ENCOUNTER — Ambulatory Visit (INDEPENDENT_AMBULATORY_CARE_PROVIDER_SITE_OTHER): Payer: Medicare Other | Admitting: Family Medicine

## 2020-07-17 ENCOUNTER — Encounter: Payer: Self-pay | Admitting: Family Medicine

## 2020-07-17 ENCOUNTER — Other Ambulatory Visit: Payer: Self-pay

## 2020-07-17 VITALS — BP 126/90 | HR 96 | Ht 67.0 in | Wt 226.0 lb

## 2020-07-17 DIAGNOSIS — E785 Hyperlipidemia, unspecified: Secondary | ICD-10-CM | POA: Diagnosis not present

## 2020-07-17 DIAGNOSIS — I10 Essential (primary) hypertension: Secondary | ICD-10-CM | POA: Diagnosis not present

## 2020-07-17 DIAGNOSIS — Z1159 Encounter for screening for other viral diseases: Secondary | ICD-10-CM

## 2020-07-17 DIAGNOSIS — E1165 Type 2 diabetes mellitus with hyperglycemia: Secondary | ICD-10-CM

## 2020-07-17 DIAGNOSIS — Z0000 Encounter for general adult medical examination without abnormal findings: Secondary | ICD-10-CM | POA: Diagnosis not present

## 2020-07-17 DIAGNOSIS — F17200 Nicotine dependence, unspecified, uncomplicated: Secondary | ICD-10-CM | POA: Diagnosis not present

## 2020-07-17 DIAGNOSIS — Z1211 Encounter for screening for malignant neoplasm of colon: Secondary | ICD-10-CM | POA: Diagnosis not present

## 2020-07-17 DIAGNOSIS — E118 Type 2 diabetes mellitus with unspecified complications: Secondary | ICD-10-CM | POA: Diagnosis not present

## 2020-07-17 DIAGNOSIS — Z23 Encounter for immunization: Secondary | ICD-10-CM | POA: Diagnosis not present

## 2020-07-17 DIAGNOSIS — E1169 Type 2 diabetes mellitus with other specified complication: Secondary | ICD-10-CM | POA: Diagnosis not present

## 2020-07-17 LAB — POCT GLYCOSYLATED HEMOGLOBIN (HGB A1C): Hemoglobin A1C: 10.3 % — AB (ref 4.0–5.6)

## 2020-07-17 MED ORDER — ONETOUCH DELICA LANCETS 33G MISC: 1.0000 | Freq: Two times a day (BID) | 11 refills | Status: DC

## 2020-07-17 MED ORDER — ONETOUCH VERIO VI STRP: ORAL_STRIP | 12 refills | Status: DC

## 2020-07-17 MED ORDER — ONETOUCH DELICA LANCING DEV MISC: 1.0000 | Freq: Two times a day (BID) | 1 refills | Status: AC

## 2020-07-17 MED ORDER — EMPAGLIFLOZIN 25 MG PO TABS: 25.0000 mg | ORAL_TABLET | Freq: Every day | ORAL | 3 refills | Status: DC

## 2020-07-17 MED ORDER — METOPROLOL SUCCINATE ER 25 MG PO TB24: 12.5000 mg | ORAL_TABLET | Freq: Every day | ORAL | 2 refills | Status: DC

## 2020-07-17 MED ORDER — ONETOUCH VERIO W/DEVICE KIT: 1.0000 | PACK | Freq: Two times a day (BID) | 0 refills | Status: DC

## 2020-07-17 NOTE — Assessment & Plan Note (Signed)
History of smoking 1/4 pack/day x 10 years (3 pack year history). Currently smokes 3-4 cigarettes a day. Actively trying to cut back. Does not qualify for lung cancer screen at this time. Patient not intestered in cessation supplies at this time - she is working on this herself. Patient was counseled on the risks of tobacco use and cessation strongly encouraged. Encouraged to call if desires assistance.

## 2020-07-17 NOTE — Assessment & Plan Note (Addendum)
Chronic, uncontrolled. Worsening A1C from 8.1 to 10.3. GI side effects with higher doses of Metformin.  Discussed management options. Patient would likely benefit from both increase in SGLT-2 and addition of GLP-1, however with shared decision making will start with increase in SGLT-2, follow up in 1 month. Have patient keep log of blood sugars. If remains elevated >180, plan to start GLP-1 (Trulicity 0.75mg  qweekly x 4 weeks with plan to increase if tolerating). Patient opted and agreed to this plan. - Increase Jardiance 25mg  QD - Continue Metformin 500mg  QD - Follow up 08/20/20, patient instructed to bring sugar log - diabetes supplies sent to pharmacy - If still uncontrolled plan to start Trulicty 0.75mg  qweekly. Follow up in 4 weeks to assess tolerability. Plan to increase to 1.5mg  if tolerating x 4 weeks. If better glycemic control is needed will plan to continue to increase to 3mg  x 4 weeks, then 4.5mg  x 4 weeks if tolerating and if indicated.

## 2020-07-17 NOTE — Assessment & Plan Note (Signed)
Chronic. Endorses compliance. Lipid panel today. Continue Atorvastatin 40mg  QD. If worsening, plan to increase to 80mg  QD

## 2020-07-17 NOTE — Patient Instructions (Signed)
It was a pleasure to see you today!  Thank you for choosing [facility] for your primary care.   Our plans for today were:  Please have your eye doctor fax over your diabetic eye results to [phone]  I have obtained labs today - I will call you if there are any changes that need to be made.  At this time, continue all of your medicines as prescribed.  Lets plan to follow up in 6 months unless we make changes (which I will call you) and   To keep you healthy, please keep in mind the following health maintenance items that you are due for:   1. Cologuard- dont forget to set near your toilet to help remind to complete and send back in the mail   You should return to our clinic in 6 months for diabetes follow up  Best Wishes,   Mina Marble, DO

## 2020-07-17 NOTE — Assessment & Plan Note (Signed)
Chronic, well controlled. - Continue HCTZ 25mg  QD and Metoprolol 12.5mg  QD - History of anaphylaxis on Lisinopril.

## 2020-07-18 ENCOUNTER — Encounter: Payer: Self-pay | Admitting: Family Medicine

## 2020-07-18 LAB — MICROALBUMIN / CREATININE URINE RATIO
Creatinine, Urine: 37.5 mg/dL
Microalb/Creat Ratio: <8 mg/g creat (ref 0–29)
Microalbumin, Urine: <3 ug/mL

## 2020-07-18 LAB — LIPID PANEL
Chol/HDL Ratio: 3.6 ratio (ref 0.0–4.4)
Cholesterol, Total: 194 mg/dL (ref 100–199)
HDL: 54 mg/dL (ref >39)
LDL Chol Calc (NIH): 104 mg/dL — ABNORMAL HIGH (ref 0–99)
Triglycerides: 208 mg/dL — ABNORMAL HIGH (ref 0–149)
VLDL Cholesterol Cal: 36 mg/dL (ref 5–40)

## 2020-07-18 LAB — BASIC METABOLIC PANEL
BUN/Creatinine Ratio: 17 (ref 9–23)
BUN: 18 mg/dL (ref 6–24)
CO2: 26 mmol/L (ref 20–29)
Calcium: 9.8 mg/dL (ref 8.7–10.2)
Chloride: 99 mmol/L (ref 96–106)
Creatinine, Ser: 1.07 mg/dL — ABNORMAL HIGH (ref 0.57–1.00)
GFR calc Af Amer: 66 mL/min/{1.73_m2} (ref >59)
GFR calc non Af Amer: 57 mL/min/{1.73_m2} — ABNORMAL LOW (ref >59)
Glucose: 312 mg/dL — ABNORMAL HIGH (ref 65–99)
Potassium: 4 mmol/L (ref 3.5–5.2)
Sodium: 141 mmol/L (ref 134–144)

## 2020-07-18 LAB — HEPATITIS B SURFACE ANTIGEN: Hepatitis B Surface Ag: NEGATIVE

## 2020-08-02 ENCOUNTER — Other Ambulatory Visit: Payer: Self-pay | Admitting: Podiatry

## 2020-08-02 DIAGNOSIS — G5792 Unspecified mononeuropathy of left lower limb: Secondary | ICD-10-CM

## 2020-08-02 DIAGNOSIS — G5791 Unspecified mononeuropathy of right lower limb: Secondary | ICD-10-CM

## 2020-08-18 NOTE — Progress Notes (Signed)
Subjective:   Patient ID: Tara Jarvis    DOB: 1961-03-12, 60 y.o. female   MRN: [id number]  Tara Jarvis is a 60 y.o. female with a history of HFrEF, HTN, HLD, T2DM, tobacco use disorder here for diabetes follow up  Diabetes: Last three A1C's below. Currently on Jardiance 25mg  QD, Metformin 500mg  QD. Endorses compliance.  Denies any hypoglycemia. Denies any polyuria, polydipsia, polyphagia. Due for diabetic eye exam. She has not been checking her blood sugars as she doesn't know how to use the machine.  Has been really what she eats. Drinking lots of water.   Lab Results  Component Value Date   HGBA1C 10.3 (A) 07/17/2020   HGBA1C 8.1 (A) 06/20/2019   HGBA1C 7.6 (A) 03/21/2019    HTN:  BP: 118/78 today. Currently on HCTZ 25mg  QD, Metoprolol 25mg  QD. History of angioedema with ACE-I. Endorses compliance. Current everyday smoker. Denies any chest pain, SOB, vision changes, or headaches.   Lab Results  Component Value Date   CREATININE 1.07 (H) 07/17/2020   CREATININE 1.04 (H) 08/30/2019   CREATININE 0.96 05/06/2019    Health Maintenance: Health Maintenance Due  Topic  . Fecal DNA (Cologuard)   . OPHTHALMOLOGY EXAM   . COVID-19 Vaccine (3 - Booster for [employer] series)   Review of Systems:  Per HPI.   Objective:   BP 118/78   Pulse 89   Wt 226 lb (102.5 kg)   SpO2 96%   BMI 35.40 kg/m  Vitals and nursing note reviewed.  General: pleasant older woman, sitting comfortably in exam chair, well nourished, well developed, in no acute distress with non-toxic appearance CV: regular rate and rhythm without murmurs, rubs, or gallops Lungs: clear to auscultation bilaterally with no chronic, normal work of breathing on room air, speaking in full sentences MSK:  gait normal Neuro: Alert and oriented, speech normal  Assessment & Plan:   Primary hypertension Chronic, well controlled.  Current everyday smoker.  On statin. History of angioedema on ACE inhibitor Continue  hydrochlorothiazide 25 mg daily and metoprolol 50 mg daily  Uncontrolled type 2 diabetes mellitus (HCC) Chronic, uncontrolled.  Has been tolerating Jardiance 25 mg daily.  Endorses improvement in GI symptoms with Metformin.  Was open to increasing dose.  Discussed management options including addition of GLP-1 however patient is reluctant to start injectables.  -Recommended to monitor blood sugars at home regularly.  Recommended she see pharmacist at drug store or return to [facility] for teaching of CBG monitor -Point-of-care CBG today 232 -Continue Jardiance 25 mg daily -Increase Metformin to 1000 mg daily with goal to increase to 2000 mg QD if tolerated -Follow-up in 2 months for repeat A1c -Diabetic eye exam scheduled for 08/23/2020.  Recommended patient have results faxed to [facility]. - If still uncontrolled will discuss starting Trulicty 0.75mg  qweekly. Follow up in 4 weeks to assess tolerability.  Tobacco use disorder Patient was counseled on the risks of tobacco use and cessation strongly encouraged.  Health Maintenance: Declined COVID booster today. Opted to wait till follow up.  Diabetec eye exam scheduled for 08/23/20  Orders Placed This Encounter  Procedures  . Glucose (CBG)   Meds ordered this encounter  Medications  . metFORMIN (GLUCOPHAGE-XR) 500 MG 24 hr tablet    Sig: Take 1 tablet (500 mg total) by mouth in the morning and at bedtime.    Dispense:  180 tablet    Refill:  [place], DO PGY-3, [facility] 08/20/2020 4:53 PM

## 2020-08-20 ENCOUNTER — Ambulatory Visit (INDEPENDENT_AMBULATORY_CARE_PROVIDER_SITE_OTHER): Payer: Medicare Other | Admitting: Family Medicine

## 2020-08-20 ENCOUNTER — Other Ambulatory Visit: Payer: Self-pay

## 2020-08-20 ENCOUNTER — Encounter: Payer: Self-pay | Admitting: Family Medicine

## 2020-08-20 VITALS — BP 118/78 | HR 89 | Wt 226.0 lb

## 2020-08-20 DIAGNOSIS — I10 Essential (primary) hypertension: Secondary | ICD-10-CM | POA: Diagnosis not present

## 2020-08-20 DIAGNOSIS — F17200 Nicotine dependence, unspecified, uncomplicated: Secondary | ICD-10-CM

## 2020-08-20 DIAGNOSIS — E1165 Type 2 diabetes mellitus with hyperglycemia: Secondary | ICD-10-CM | POA: Diagnosis not present

## 2020-08-20 LAB — GLUCOSE, POCT (MANUAL RESULT ENTRY): POC Glucose: 232 mg/dl — AB (ref 70–99)

## 2020-08-20 MED ORDER — METFORMIN HCL ER 500 MG PO TB24: 500.0000 mg | ORAL_TABLET | Freq: Two times a day (BID) | ORAL | 3 refills | Status: DC

## 2020-08-20 NOTE — Patient Instructions (Signed)
It was a pleasure to see you today!  Thank you for choosing [facility] for your primary care.   Our plans for today were:  Continue the Jardiance 25mg  daily. Drink lots of water  Increase the Metformin to two tablets daily (either both in the morning or one in the morning and one at night). If you tolerate the two tablets in the morning, then I strongly urge to increase to two tablets twice a day (mroning and night) to help maximize the medicine.  Your blood pressure looks great. Keep taking the hydrochlorothiazide and Metoprolol  Continue to get off those cigarettes!!!!!  To keep you healthy, please keep in mind the following health maintenance items that you are due for:   1. Colon cancer screening 2. COVID booster  BRING ALL OF YOUR MEDICATIONS and your SUGAR MONITOR WITH YOU TO EVERY VISIT   You should return to our clinic in 2 months for diabetes follow up  Best Wishes,   Mina Marble, DO

## 2020-08-20 NOTE — Assessment & Plan Note (Addendum)
Chronic, uncontrolled.  Has been tolerating Jardiance 25 mg daily.  Endorses improvement in GI symptoms with Metformin.  Was open to increasing dose.  Discussed management options including addition of GLP-1 however patient is reluctant to start injectables.  -Recommended to monitor blood sugars at home regularly.  Recommended she see pharmacist at drug store or return to [facility] for teaching of CBG monitor -Point-of-care CBG today 232 -Continue Jardiance 25 mg daily -Increase Metformin to 1000 mg daily with goal to increase to 2000 mg QD if tolerated -Follow-up in 2 months for repeat A1c -Diabetic eye exam scheduled for 08/23/2020.  Recommended patient have results faxed to [facility]. - If still uncontrolled will discuss starting Trulicty 0.75mg  qweekly. Follow up in 4 weeks to assess tolerability.

## 2020-08-20 NOTE — Assessment & Plan Note (Signed)
Patient was counseled on the risks of tobacco use and cessation strongly encouraged.

## 2020-08-20 NOTE — Assessment & Plan Note (Signed)
Chronic, well controlled.  Current everyday smoker.  On statin. History of angioedema on ACE inhibitor Continue hydrochlorothiazide 25 mg daily and metoprolol 50 mg daily

## 2020-08-23 DIAGNOSIS — H401132 Primary open-angle glaucoma, bilateral, moderate stage: Secondary | ICD-10-CM | POA: Diagnosis not present

## 2020-08-29 ENCOUNTER — Other Ambulatory Visit: Payer: Self-pay | Admitting: Podiatry

## 2020-08-29 ENCOUNTER — Other Ambulatory Visit: Payer: Self-pay | Admitting: Family Medicine

## 2020-08-29 DIAGNOSIS — I10 Essential (primary) hypertension: Secondary | ICD-10-CM

## 2020-08-29 NOTE — Telephone Encounter (Signed)
Please advise 

## 2020-10-14 NOTE — Progress Notes (Signed)
Subjective:   Patient ID: Tara Jarvis    DOB: 06-08-1961, 60 y.o. female   MRN: [id number]  Tara Jarvis is a 60 y.o. female with a history of HFrEF, HTN, HLD, T2DM, DOE, tobacco use disorder here for diabetes follow up  Diabetes: Last three A1C's below. Currently on Jardiance 25mg  QD and Metformin 1000mg  daily due to GI upset with higher doses. Endorses compliance and tolerating well. Due for diabetic eye exam  Lab Results  Component Value Date   HGBA1C 9.8 (A) 10/18/2020   HGBA1C 10.3 (A) 07/17/2020   HGBA1C 8.1 (A) 06/20/2019    HTN:  BP: 132/80 today. Currently on HCTZ 25mg  QD and Metoprolol 12.5mg  QD. Endorses compliance. Current Smoker: 2 cig/day. Denies any chest pain, SOB, vision changes, or headaches.   Lab Results  Component Value Date   CREATININE 1.07 (H) 07/17/2020   CREATININE 1.04 (H) 08/30/2019   CREATININE 0.96 05/06/2019    HLD: Last lipid panel below. Currently on Atorvastatin 40mg  QD. Endorses compliance. Denies any muscles aches or weakness. The 10-year ASCVD risk score [name]., et al., 2013) is: 27.5%   Lab Results  Component Value Date   CHOL 194 07/17/2020   HDL 54 07/17/2020   LDLCALC 104 (H) 07/17/2020   TRIG 208 (H) 07/17/2020   CHOLHDL 3.6 07/17/2020   Review of Systems:  Per HPI.   Objective:   BP 132/80   Pulse 82   Wt 224 lb 6.4 oz (101.8 kg)   SpO2 99%   BMI 35.15 kg/m  Vitals and nursing note reviewed.  General: pleasant older female, sitting comfortably in exam chair, well nourished, well developed, in no acute distress with non-toxic appearance Resp: breathing comfortably on room air, speaking in full sentences Extremities:  normal tone MSK: Rgait normal Neuro: Alert and oriented, speech normal  Depression screen [facility] 2/9 10/18/2020 08/20/2020 06/20/2019  Decreased Interest 0 0 3  Down, Depressed, Hopeless 0 0 3  PHQ - 2 Score 0 0 6  Altered sleeping 0 0 3  Tired, decreased energy 0 0 0  Change in appetite 0 0  0  Feeling bad or failure about yourself  0 0 0  Trouble concentrating 0 0 1  Moving slowly or fidgety/restless 0 0 0  Suicidal thoughts 0 0 0  PHQ-9 Score 0 0 10  Difficult doing work/chores - - Somewhat difficult   Assessment & Plan:   Primary hypertension Chronic, normotensive. Continue Metoprolol and HCTZ as prescribed BMP in January with stable kidney function  Hyperlipidemia associated with type 2 diabetes mellitus (HCC) Chronic. Consider repeat direct LDL at follow up visit to monitor for improvement and consider increasing Atorvastatin to 80mg  QD  Uncontrolled type 2 diabetes mellitus (HCC) Chronic. Slightly improved but still above goal. Has lost 2lbs! Currently on Metformin 500mg  BID due to GI intolerance Compliant with Jardiance Start Trulicity 0.75mg  qweekly  Follow up 11/22/20 for reevaluation Likely benefit from diabetic education. Consider referral to [facility] This Encounter  Procedures  . POCT glycosylated hemoglobin (Hb A1C)   Meds ordered this encounter  Medications  . Dulaglutide (TRULICITY) 6.94 WN/4.6EV SOPN    Sig: Inject 0.75 mg into the skin once a week.    Dispense:  3 mL    Refill:  1  . Alcohol Swabs (ALCOHOL PREP) 70 % PADS    Sig: 1 each by Does not apply route once a week.    Dispense:  100 each  Refill:  1  . empagliflozin (JARDIANCE) 25 MG TABS tablet    Sig: Take 1 tablet (25 mg total) by mouth daily.    Dispense:  90 tablet    Refill:  Philip, DO PGY-3, [facility] 10/18/2020 2:52 PM

## 2020-10-18 ENCOUNTER — Encounter: Payer: Self-pay | Admitting: Family Medicine

## 2020-10-18 ENCOUNTER — Ambulatory Visit (INDEPENDENT_AMBULATORY_CARE_PROVIDER_SITE_OTHER): Payer: Medicare Other | Admitting: Family Medicine

## 2020-10-18 ENCOUNTER — Other Ambulatory Visit: Payer: Self-pay

## 2020-10-18 VITALS — BP 132/80 | HR 82 | Wt 224.4 lb

## 2020-10-18 DIAGNOSIS — I10 Essential (primary) hypertension: Secondary | ICD-10-CM

## 2020-10-18 DIAGNOSIS — E785 Hyperlipidemia, unspecified: Secondary | ICD-10-CM

## 2020-10-18 DIAGNOSIS — E1165 Type 2 diabetes mellitus with hyperglycemia: Secondary | ICD-10-CM

## 2020-10-18 DIAGNOSIS — E1169 Type 2 diabetes mellitus with other specified complication: Secondary | ICD-10-CM

## 2020-10-18 LAB — POCT GLYCOSYLATED HEMOGLOBIN (HGB A1C): HbA1c, POC (controlled diabetic range): 9.8 % — AB (ref 0.0–7.0)

## 2020-10-18 MED ORDER — ALCOHOL PREP 70 % PADS: 1.0000 | MEDICATED_PAD | 1 refills | Status: AC

## 2020-10-18 MED ORDER — TRULICITY 0.75 MG/0.5ML ~~LOC~~ SOAJ: 0.7500 mg | SUBCUTANEOUS | 1 refills | Status: DC

## 2020-10-18 MED ORDER — EMPAGLIFLOZIN 25 MG PO TABS: 25.0000 mg | ORAL_TABLET | Freq: Every day | ORAL | 3 refills | Status: DC

## 2020-10-18 NOTE — Assessment & Plan Note (Signed)
Chronic, normotensive. Continue Metoprolol and HCTZ as prescribed BMP in January with stable kidney function

## 2020-10-18 NOTE — Assessment & Plan Note (Signed)
Chronic. Slightly improved but still above goal. Has lost 2lbs! Currently on Metformin 500mg  BID due to GI intolerance Compliant with Jardiance Start Trulicity 0.75mg  qweekly  Follow up 11/22/20 for reevaluation Likely benefit from diabetic education. Consider referral to [facility]

## 2020-10-18 NOTE — Patient Instructions (Signed)
It was a pleasure to see you today!  Thank you for choosing [facility] for your primary care.   Our plans for today were:  Diabetes: Your A1C was 9.8 today. Some improvement from 10.3 in January.   Please continue the Jardiance 25mg  daily and Metformin 500mg  twice a day  Please start Trulicity (Dulaglutide) 0.75mg  once a week. Be sure to clean the skin with alcohol prior to use.   Follow up in 1 month (11/22/20 at 1:30pm) for diabetes follow up  Blood pressure: Continue your Hydrochlorothiazide and Metoprolol as prescribed  Cholesterol: Continue your Atorvastatin.  To keep you healthy, please keep in mind the following health maintenance items that you are due for:   1. Colon cancer screening 2. Diabetic eye exam  3.  BRING ALL OF YOUR MEDICATIONS WITH YOU TO EVERY VISIT   You should return to our clinic on 11/22/20 at 1:30pm    Best Wishes,   Mina Marble, DO

## 2020-10-18 NOTE — Assessment & Plan Note (Signed)
Chronic. Consider repeat direct LDL at follow up visit to monitor for improvement and consider increasing Atorvastatin to 80mg  QD

## 2020-11-22 ENCOUNTER — Ambulatory Visit: Payer: Medicare Other | Admitting: Family Medicine

## 2020-11-26 DIAGNOSIS — H401132 Primary open-angle glaucoma, bilateral, moderate stage: Secondary | ICD-10-CM | POA: Diagnosis not present

## 2020-12-16 ENCOUNTER — Other Ambulatory Visit: Payer: Self-pay | Admitting: Family Medicine

## 2020-12-16 DIAGNOSIS — E1165 Type 2 diabetes mellitus with hyperglycemia: Secondary | ICD-10-CM

## 2020-12-16 DIAGNOSIS — E118 Type 2 diabetes mellitus with unspecified complications: Secondary | ICD-10-CM

## 2020-12-17 ENCOUNTER — Other Ambulatory Visit: Payer: Self-pay | Admitting: Family Medicine

## 2020-12-17 DIAGNOSIS — E1165 Type 2 diabetes mellitus with hyperglycemia: Secondary | ICD-10-CM

## 2020-12-17 MED ORDER — EMPAGLIFLOZIN 25 MG PO TABS: 25.0000 mg | ORAL_TABLET | Freq: Every day | ORAL | 3 refills | Status: DC

## 2021-02-09 ENCOUNTER — Ambulatory Visit (INDEPENDENT_AMBULATORY_CARE_PROVIDER_SITE_OTHER): Payer: Medicare Other

## 2021-02-09 DIAGNOSIS — Z5329 Procedure and treatment not carried out because of patient's decision for other reasons: Secondary | ICD-10-CM

## 2021-02-09 NOTE — Progress Notes (Signed)
1st Attempt to connect with patient at 12:00pm - LVM at [phone] 2nd Attempt to connect with patient at 12:15pm - LVM

## 2021-03-05 DIAGNOSIS — H401132 Primary open-angle glaucoma, bilateral, moderate stage: Secondary | ICD-10-CM | POA: Diagnosis not present

## 2021-03-20 ENCOUNTER — Ambulatory Visit (INDEPENDENT_AMBULATORY_CARE_PROVIDER_SITE_OTHER): Payer: Medicare Other | Admitting: Family Medicine

## 2021-03-20 ENCOUNTER — Encounter: Payer: Self-pay | Admitting: Family Medicine

## 2021-03-20 ENCOUNTER — Other Ambulatory Visit: Payer: Self-pay

## 2021-03-20 VITALS — BP 142/93 | HR 88 | Ht 67.0 in | Wt 218.2 lb

## 2021-03-20 DIAGNOSIS — Z23 Encounter for immunization: Secondary | ICD-10-CM | POA: Diagnosis not present

## 2021-03-20 DIAGNOSIS — E1165 Type 2 diabetes mellitus with hyperglycemia: Secondary | ICD-10-CM

## 2021-03-20 DIAGNOSIS — F17200 Nicotine dependence, unspecified, uncomplicated: Secondary | ICD-10-CM

## 2021-03-20 DIAGNOSIS — I10 Essential (primary) hypertension: Secondary | ICD-10-CM | POA: Diagnosis not present

## 2021-03-20 LAB — POCT GLYCOSYLATED HEMOGLOBIN (HGB A1C): HbA1c, POC (controlled diabetic range): 9.3 % — AB (ref 0.0–7.0)

## 2021-03-20 MED ORDER — ZOSTER VAC RECOMB ADJUVANTED 50 MCG/0.5ML IM SUSR: 0.5000 mL | Freq: Once | INTRAMUSCULAR | 0 refills | Status: AC

## 2021-03-20 MED ORDER — METFORMIN HCL ER 500 MG PO TB24: 1000.0000 mg | ORAL_TABLET | Freq: Every day | ORAL | 3 refills | Status: DC

## 2021-03-20 NOTE — Assessment & Plan Note (Signed)
Is interested in quitting completely, but not sure when. Has successfully cut down to 3-4 cigarettes a day 2-3 times a week. Congratulated patient on this accomplishment.  Reminded patient that we are available as a resource if she needs any assistance with meeting her goal of quitting.

## 2021-03-20 NOTE — Assessment & Plan Note (Addendum)
Poorly controlled with HbA1C of 9.3 today. She takes Jardiance 25 mg daily and metformin 500 mg BID almost daily. Discussed medication adjustment options to control diabetes including increasing metformin dose, initiating basil insulin or a continuous glucose monitoring patch. Per patient preference, increasing metformin dose to 1000 mg BID. Given patient's sensitivity to GI side effects of metformin, recommend patient take imodium AD (available over the counter) if diarrhea worsens. Records from annual diabetic eye exam are on Care Everywhere.

## 2021-03-20 NOTE — Patient Instructions (Addendum)
Please take two metformin daily - every morning with a little something on your stomach. If it helps, you can also take an over the counter immodium AD with the metformin to decrease the diarrhea.   You got the flu shot today. I sent in the shingles vaccine.  Remember, it is two shots, with at least two months apart.  Also remember, it is likely to cause you to feel bad for 1-2 days.  I still recommend it.   See [place] again in three months for a diabetes recheck. I am glad you are strongly focused on your health.   We will check your cholesterol next visit.

## 2021-03-20 NOTE — Assessment & Plan Note (Addendum)
BP Slightly elevated in office today at 142/93. Patient reports she was rushing into office. She does not take BP measurements at home. Discussed purchasing a BP cuff to check blood pressure daily. Will recheck BP at next visit. No medication changes today.

## 2021-03-20 NOTE — Progress Notes (Signed)
SUBJECTIVE:   CHIEF COMPLAINT / HPI:   Tara Jarvis is here today for an annual follow-up.  Her main concern today is diabetes. HbA1C today was 9.3, down from 9.8 a few months ago. She has been cutting back on fatty foods and been making lifestyle adjustments. She takes Jardiance 25 mg daily and metformin 500 mg BID almost daily. She acknowledges she sometimes forget to take metformin in the evening because she is so tired. On occasion, she skips metformin because it makes her go to the bathroom immediately and doesn't like being caught in public with needing to go. We discussed incorporating more fiber into her diet to help counteract the GI effects of metformin.  Tara Jarvis does not have any acute concerns today.  In terms of chronic conditions, Tara Jarvis would like to know if she needs to adjust her hypertension medications today. BP in office was 142/93, but she was rushing to get here. She does not have a BP cuff at home to measure so is unsure of what her BP is normally. She takes atorvastatin 40 mg hydrochlorothiazide 25 mg and metoprolol 25 mg daily. She denies dizziness or headaches.  We discussed the following health promotion and disease prevention topics today: -She had her annual eye exam 2 weeks ago with Dr. Gershon Crane. -She received the Fecal DNA kit yesterday. She wasn't sure what to do with it. We discussed how to send in a sample. -She is interested in the Shingrix vaccine. -She has been cutting down on tobacco use and is down to 3-4 cigarettes a day 2-3 times a week. Her goal is to quit. She has not decided when she hopes to quit by but knows she wants to. She reports that it is more of a habit than "needing" to and that she usually smokes when she drinks. -She drinks about 2-3 drinks with liquor on weekend days. -She is not sexually active currently.   PERTINENT  PMH / PSH:  Hypertension T2D Hyperlipidemia   OBJECTIVE:   BP (!) 142/93   Pulse 88   Ht _0  (1.702 m)   Wt 218 lb  3.2 oz (99 kg)   SpO2 98%   BMI 34.17 kg/m   GEN: Well appearing, in no acute distress CARD: RRR, no murmurs or gallops PULM: Clear to auscultation, normal work kof breathing GI: Soft, non-tender, no distention EXTREMITIES: No cyanosis or edema NEUR: Grossly normal  ASSESSMENT/PLAN:   Primary hypertension BP Slightly elevated in office today at 142/93. Patient reports she was rushing into office. She does not take BP measurements at home. Will recheck BP at next visit. No medication changes today.  Uncontrolled type 2 diabetes mellitus ([place]) Poorly controlled with HbA1C of 9.3 today. She takes Jardiance 25 mg daily and metformin 500 mg BID almost daily. Discussed medication adjustment options to control diabetes including increasing metformin dose, initiating basil insulin or a continuous glucose monitoring patch. Per patient preference, increasing metformin dose to 1000 mg BID. Given patient's sensitivity to GI side effects of metformin, recommend patient take imodium AD (available over the counter) if diarrhea worsens. Records from annual diabetic eye exam are on Care Everywhere.  Tobacco use disorder Is interested in quitting completely, but not sure when. Has successfully cut down to 3-4 cigarettes a day 2-3 times a week. Congratulated patient on this accomplishment.  Reminded patient that we are available as a resource if she needs any assistance with meeting her goal of quitting.   Health Maintenance Received Flu vaccine  today. Provided prescription for Shingrix vaccine to be obtained from local pharmacy. Reviewed process to send in Fecal DNA kit and answered outstanding questions.  [place]

## 2021-03-21 ENCOUNTER — Encounter: Payer: Self-pay | Admitting: Family Medicine

## 2021-03-21 NOTE — Progress Notes (Signed)
As is well documented in MS3 Santanam's note, we focused on trying to increase her metformin.  Patient has maxed out on both GLP1 and SGLT2  If cannot better control with metformin, next step is insulin, which patient strongly wants to avoid.

## 2021-04-09 ENCOUNTER — Other Ambulatory Visit: Payer: Self-pay | Admitting: Family Medicine

## 2021-04-09 DIAGNOSIS — E118 Type 2 diabetes mellitus with unspecified complications: Secondary | ICD-10-CM

## 2021-04-09 DIAGNOSIS — I10 Essential (primary) hypertension: Secondary | ICD-10-CM

## 2021-05-12 ENCOUNTER — Other Ambulatory Visit: Payer: Self-pay | Admitting: Family Medicine

## 2021-05-12 DIAGNOSIS — E1165 Type 2 diabetes mellitus with hyperglycemia: Secondary | ICD-10-CM

## 2021-06-04 LAB — HM DIABETES EYE EXAM

## 2021-08-07 ENCOUNTER — Other Ambulatory Visit: Payer: Self-pay | Admitting: *Deleted

## 2021-08-07 DIAGNOSIS — E1165 Type 2 diabetes mellitus with hyperglycemia: Secondary | ICD-10-CM

## 2021-08-07 DIAGNOSIS — I10 Essential (primary) hypertension: Secondary | ICD-10-CM

## 2021-08-08 MED ORDER — ONETOUCH VERIO VI STRP: ORAL_STRIP | 12 refills | Status: DC

## 2021-08-08 MED ORDER — METOPROLOL SUCCINATE ER 25 MG PO TB24: 12.5000 mg | ORAL_TABLET | Freq: Every day | ORAL | 0 refills | Status: DC

## 2021-08-08 MED ORDER — HYDROCHLOROTHIAZIDE 25 MG PO TABS: 25.0000 mg | ORAL_TABLET | Freq: Every day | ORAL | 0 refills | Status: DC

## 2021-08-19 ENCOUNTER — Encounter: Payer: Self-pay | Admitting: Student

## 2021-08-19 ENCOUNTER — Other Ambulatory Visit: Payer: Self-pay

## 2021-08-19 ENCOUNTER — Ambulatory Visit (INDEPENDENT_AMBULATORY_CARE_PROVIDER_SITE_OTHER): Payer: Medicare Other | Admitting: Student

## 2021-08-19 VITALS — BP 137/89 | HR 77 | Ht 67.0 in | Wt 218.4 lb

## 2021-08-19 DIAGNOSIS — F17200 Nicotine dependence, unspecified, uncomplicated: Secondary | ICD-10-CM

## 2021-08-19 DIAGNOSIS — Z1211 Encounter for screening for malignant neoplasm of colon: Secondary | ICD-10-CM | POA: Diagnosis not present

## 2021-08-19 DIAGNOSIS — E785 Hyperlipidemia, unspecified: Secondary | ICD-10-CM | POA: Diagnosis not present

## 2021-08-19 DIAGNOSIS — I10 Essential (primary) hypertension: Secondary | ICD-10-CM

## 2021-08-19 DIAGNOSIS — E1169 Type 2 diabetes mellitus with other specified complication: Secondary | ICD-10-CM | POA: Diagnosis not present

## 2021-08-19 DIAGNOSIS — E1165 Type 2 diabetes mellitus with hyperglycemia: Secondary | ICD-10-CM | POA: Diagnosis not present

## 2021-08-19 DIAGNOSIS — Z0000 Encounter for general adult medical examination without abnormal findings: Secondary | ICD-10-CM

## 2021-08-19 LAB — POCT GLYCOSYLATED HEMOGLOBIN (HGB A1C): HbA1c, POC (controlled diabetic range): 10.3 % — AB (ref 0.0–7.0)

## 2021-08-19 MED ORDER — NICOTINE POLACRILEX 4 MG MT GUM: 4.0000 mg | CHEWING_GUM | OROMUCOSAL | 0 refills | Status: AC | PRN

## 2021-08-19 MED ORDER — TRULICITY 0.75 MG/0.5ML ~~LOC~~ SOAJ: 1.5000 mg | SUBCUTANEOUS | 1 refills | Status: DC

## 2021-08-19 NOTE — Patient Instructions (Signed)
It was great to see you! Thank you for allowing me to participate in your care!  I recommend that you always bring your medications to each appointment as this makes it easy to ensure we are on the correct medications and helps [place] not miss when refills are needed.  Our plans for today:  - Diabetes management:  Hgb A1c is increased today, to 10.3. We will need to increase your diabetes medicine. We are increasing the dose of your Trulicity. We will also obtain a microalbuminuria Please start walking 3-5 times a week. 10 min one way, then walk back Follow up in 3 months  - BP: Blood Pressure is ok today. Will hold on BP meds.   -Will check electrolytes and kidney function  - Health Screenings: We will obtain a Mammogram and a Colonoscopy  - Smoking Cessation: We will start you on nicorette gume  - Cholesterol: We will check your cholesterol today   We are checking some labs today, I will call you if they are abnormal will send you a MyChart message or a letter if they are normal.  If you do not hear about your labs in the next 2 weeks please let us know.  Take care and seek immediate care sooner if you develop any concerns.   Follow up in 3 months!  Dr. Holley Bouche, MD [place]

## 2021-08-19 NOTE — Progress Notes (Signed)
SUBJECTIVE:   CHIEF COMPLAINT / HPI:   Follow  up HTN and DM  HTN:  Patient with history of HTN, wondering if medication should be adjusted.  Meds: Metoprolol Succ 12.5, HCTZ 25,  BP 137/89 today, is ok. Will continue meds at current level. No headache/chest pain, blurry vision BP Readings from Last 3 Encounters:  08/19/21 137/89  03/20/21 (!) 142/93  10/18/20 132/80   DM: Patient last A1c 9.3, down from 9.8. Patient wanting to avoid taking insulin with increase does in metformin and max dose of other medications. A1c today 10.3, will increase Trulicity to 1.5 Meds: Trulicity 0.09 weekly, Jardiance 25 daily, Metformin XR 1000 mg daily -Doesn't take every day. But takes them almost every day. Reports that she hasn't been taking them everyday because of the GI symptoms that keep her on the toilet.  Lab Results  Component Value Date   HGBA1C 10.3 (A) 08/19/2021  Been cutting back on how much she eats, cutting down on sweet drinks. Works a lot as Quarry manager. Encouarged walking.  -Urine Microablumin -Foot Exam  HLD Patient due for lipid panel. Patient taking atorvastatin 40 mg dialy Lab Results  Component Value Date   CHOL 194 07/17/2020   HDL 54 07/17/2020   LDLCALC 104 (H) 07/17/2020   TRIG 208 (H) 07/17/2020   CHOLHDL 3.6 07/17/2020     Health Maintenance Cologuard, hasn't done, will do colonoscopy Mammogram due, patient will do.  Tobacco Use : Still cutting back. Smokes 2-3 cigarettes BID. Will try the gum, want's some.    PERTINENT  PMH / PSH: HTN, DM  Past Medical History:  Diagnosis Date   Diabetes mellitus without complication ([place])    Hypertension     Past Surgical History:  Procedure Laterality Date   BACK SURGERY  1998   Ruptured disc left lumbar    OBJECTIVE:  BP 137/89    Pulse 77    Ht 5\' 7"  (1.702 m)    Wt 99.1 kg    SpO2 100%    BMI 34.21 kg/m   General: NAD, pleasant, able to participate in exam Cardiac: RRR, no murmurs  auscultated. Respiratory: CTAB, normal effort, no wheezes, rales or rhonchi Abdomen: soft, non-tender, non-distended, normoactive bowel sounds Extremities: warm and well perfused, no edema or cyanosis. Skin: warm and dry, no rashes noted Neuro: alert, no obvious focal deficits, speech normal Psych: Normal affect and mood  ASSESSMENT/PLAN:  Primary hypertension Patient BP ok today.  -Continue home meds -Continue to monitor -BMP  Uncontrolled type 2 diabetes mellitus ([place]) Patient A1c worse today 10.3 from 9.3 last visit. Patient reports she is taking her meds most days, but does avoid the metformin some days, because of the GI symptoms. Will also obtain a Urine microalbumin today. Foot exam at next visit. -Decrease metformin to 500 mg daily -Increase Trulicity to 1.5 mg weekly -Continue Jardiance 25 daily -Urine microalbumin -Hgb A1c in 3 months  Hyperlipidemia associated with type 2 diabetes mellitus ([place]) Last lipid panel 07/17/20. Will obtain today -Lipid panel -Adjust statin as needed  Colon cancer screening Patient has been avoiding performing Cologuard and would prefer to go for Colonosocopy -Amb Ref to GI for colonoscopy  Tobacco use disorder Patient cutting back, smoking 2-3 cigarettes BID with meals. Wanting to try nicotine gum -Nicorette 4 mg gum  Breast Cancer Screening -Referral for mammogram   Orders Placed This Encounter  Procedures   MM Digital Diagnostic Bilat    Standing Status:   Future  Standing Expiration Date:   08/19/2022    Order Specific Question:   Reason for Exam (SYMPTOM  OR DIAGNOSIS REQUIRED)    Answer:   Breast Cancer Screening    Order Specific Question:   Is the patient pregnant?    Answer:   No    Order Specific Question:   Preferred imaging location?    Answer:   [facility]   Basic Metabolic Panel   Microalbumin / creatinine urine ratio    Standing Status:   Future    Standing Expiration Date:   08/19/2022   Lipid panel     Order Specific Question:   Has the patient fasted?    Answer:   No   Ambulatory referral to Gastroenterology    Referral Priority:   Routine    Referral Type:   Consultation    Referral Reason:   Specialty Services Required    Number of Visits Requested:   1   HgB A1c   Meds ordered this encounter  Medications   Dulaglutide (TRULICITY) 3.73 SK/8.7GO SOPN    Sig: Inject 1.5 mg into the skin once a week.    Dispense:  2 mL    Refill:  1   nicotine polacrilex (NICORETTE) 4 MG gum    Sig: Take 1 each (4 mg total) by mouth as needed for smoking cessation.    Dispense:  100 tablet    Refill:  0   No follow-ups on file. @SIGNNOTE @

## 2021-08-20 NOTE — Assessment & Plan Note (Addendum)
Patient BP ok today.  -Continue home meds -Continue to monitor -BMP

## 2021-08-20 NOTE — Assessment & Plan Note (Addendum)
Patient A1c worse today 10.3 from 9.3 last visit. Patient reports she is taking her meds most days, but does avoid the metformin some days, because of the GI symptoms. Will also obtain a Urine microalbumin today. Foot exam at next visit. -Decrease metformin to 500 mg daily -Increase Trulicity to 1.5 mg weekly -Continue Jardiance 25 daily -Urine microalbumin -Hgb A1c in 3 months

## 2021-08-21 DIAGNOSIS — R300 Dysuria: Secondary | ICD-10-CM | POA: Insufficient documentation

## 2021-08-21 DIAGNOSIS — A159 Respiratory tuberculosis unspecified: Secondary | ICD-10-CM | POA: Insufficient documentation

## 2021-08-21 NOTE — Assessment & Plan Note (Signed)
Patient has been avoiding performing Cologuard and would prefer to go for Colonosocopy ?-Amb Ref to GI for colonoscopy ?

## 2021-08-21 NOTE — Assessment & Plan Note (Signed)
Last lipid panel 07/17/20. Will obtain today ?-Lipid panel ?-Adjust statin as needed ?

## 2021-08-21 NOTE — Assessment & Plan Note (Signed)
Patient cutting back, smoking 2-3 cigarettes BID with meals. Wanting to try nicotine gum ?-Nicorette 4 mg gum ?

## 2021-09-02 ENCOUNTER — Ambulatory Visit
Admission: RE | Admit: 2021-09-02 | Discharge: 2021-09-02 | Disposition: A | Discharge status: Home / Self Care | Payer: Medicare Other | Source: Ambulatory Visit | Attending: Family Medicine | Admitting: Family Medicine

## 2021-09-02 DIAGNOSIS — Z1231 Encounter for screening mammogram for malignant neoplasm of breast: Secondary | ICD-10-CM | POA: Diagnosis not present

## 2021-09-02 DIAGNOSIS — Z0000 Encounter for general adult medical examination without abnormal findings: Secondary | ICD-10-CM

## 2021-09-03 ENCOUNTER — Other Ambulatory Visit: Payer: Self-pay | Admitting: Student

## 2021-09-03 DIAGNOSIS — I10 Essential (primary) hypertension: Secondary | ICD-10-CM

## 2021-09-03 DIAGNOSIS — H401132 Primary open-angle glaucoma, bilateral, moderate stage: Secondary | ICD-10-CM | POA: Diagnosis not present

## 2021-09-18 ENCOUNTER — Other Ambulatory Visit: Payer: Self-pay | Admitting: Student

## 2021-09-18 DIAGNOSIS — I10 Essential (primary) hypertension: Secondary | ICD-10-CM

## 2021-10-24 ENCOUNTER — Ambulatory Visit: Payer: Medicare Other | Admitting: Student

## 2021-10-24 NOTE — Progress Notes (Deleted)
    SUBJECTIVE:   CHIEF COMPLAINT / HPI:   Diabetes A1c was 10.3 at last visit. Trulicity was increased to 1.'5mg'$  weekly at that visit. Also taking Jardiance '25mg'$  daily. Metformin was decreased to '500mg'$  daily due to GI upset at '1000mg'$ .  Does *** check her glucose at home.   PERTINENT  PMH / PSH: ***  OBJECTIVE:   There were no vitals taken for this visit.  ***  ASSESSMENT/PLAN:   No problem-specific Assessment & Plan notes found for this encounter.     Pearla Dubonnet, MD [place]

## 2021-10-25 ENCOUNTER — Ambulatory Visit: Payer: Medicare Other

## 2021-10-25 NOTE — Progress Notes (Deleted)
    SUBJECTIVE:   CHIEF COMPLAINT / HPI: back problem   ***  PERTINENT  PMH / PSH: ***  OBJECTIVE:   There were no vitals taken for this visit.  Physical Exam   ASSESSMENT/PLAN:   No problem-specific Assessment & Plan notes found for this encounter.     Tara Foster, MD [place]

## 2021-10-27 ENCOUNTER — Emergency Department (HOSPITAL_COMMUNITY): Payer: Medicare Other

## 2021-10-27 ENCOUNTER — Emergency Department (HOSPITAL_COMMUNITY)
Admission: EM | Admit: 2021-10-27 | Discharge: 2021-10-27 | Disposition: A | Discharge status: Home / Self Care | Payer: Medicare Other | Attending: Emergency Medicine | Admitting: Emergency Medicine

## 2021-10-27 ENCOUNTER — Other Ambulatory Visit: Payer: Self-pay

## 2021-10-27 ENCOUNTER — Encounter (HOSPITAL_COMMUNITY): Payer: Self-pay

## 2021-10-27 DIAGNOSIS — Z794 Long term (current) use of insulin: Secondary | ICD-10-CM | POA: Diagnosis not present

## 2021-10-27 DIAGNOSIS — M4126 Other idiopathic scoliosis, lumbar region: Secondary | ICD-10-CM | POA: Diagnosis not present

## 2021-10-27 DIAGNOSIS — M5442 Lumbago with sciatica, left side: Secondary | ICD-10-CM | POA: Diagnosis not present

## 2021-10-27 DIAGNOSIS — M48061 Spinal stenosis, lumbar region without neurogenic claudication: Secondary | ICD-10-CM | POA: Insufficient documentation

## 2021-10-27 DIAGNOSIS — M545 Low back pain, unspecified: Secondary | ICD-10-CM | POA: Diagnosis not present

## 2021-10-27 MED ORDER — PREDNISONE 10 MG (21) PO TBPK: ORAL_TABLET | Freq: Every day | ORAL | 0 refills | Status: DC

## 2021-10-27 MED ORDER — METHOCARBAMOL 500 MG PO TABS
500.0000 mg | ORAL_TABLET | Freq: Once | ORAL | Status: AC
  Administered 2021-10-27: 500 mg via ORAL
  Filled 2021-10-27: qty 1

## 2021-10-27 MED ORDER — LIDOCAINE 5 % EX PTCH
1.0000 | MEDICATED_PATCH | CUTANEOUS | Status: DC
  Administered 2021-10-27: 1 via TRANSDERMAL
  Filled 2021-10-27: qty 1

## 2021-10-27 MED ORDER — PREDNISONE 20 MG PO TABS
60.0000 mg | ORAL_TABLET | Freq: Once | ORAL | Status: AC
Start: 2021-10-27 — End: 2021-10-27
  Administered 2021-10-27: 60 mg via ORAL
  Filled 2021-10-27: qty 3

## 2021-10-27 MED ORDER — KETOROLAC TROMETHAMINE 60 MG/2ML IM SOLN
30.0000 mg | Freq: Once | INTRAMUSCULAR | Status: AC
  Administered 2021-10-27: 30 mg via INTRAMUSCULAR
  Filled 2021-10-27: qty 2

## 2021-10-27 MED ORDER — METHOCARBAMOL 500 MG PO TABS: 500.0000 mg | ORAL_TABLET | Freq: Two times a day (BID) | ORAL | 0 refills | Status: AC

## 2021-10-27 NOTE — ED Triage Notes (Signed)
Hx of back surgery more than 5 years ago. Here for lower back pain and shooting pain from left hip to foot. Ambulatory. Pt is crying due to pain. Reports pain started 5 days ago while at work. 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  ?

## 2021-10-27 NOTE — ED Notes (Signed)
Taken to CT by ct tech. ?

## 2021-10-27 NOTE — Discharge Instructions (Addendum)
Please pick up medications and take as prescribed. DO NOT DRIVE OR DRINK ALCOHOL WHILE TAKING THE MUSCLE RELAXER AS IT CAN MAKE YOU DROWSY.  ? ?It is recommended that you take the muscle relaxer at nighttime to help you sleep. ? ?Avoid NSAIDs while taking the prednisone. You can take Tylenol as needed for pain. You can also buy OTC salon pas patches to apply to your back to help with pain.  ? ?Follow up with [facility] for further evaluation of your symptoms.  ? ?Return to the ED for any new/worsening symptoms including worsening pain, inability to ambulate, numbness in your groin, peeing or pooping on yourself, holding onto urine, foot drop, or any other new/concerning symptoms.  ? ? ?

## 2021-10-27 NOTE — ED Provider Notes (Signed)
?[facility] ?Provider Note ? ? ?CSN: [id number] ?Arrival date & time: 10/27/21  2353 ? ?  ? ?History ? ?Chief Complaint  ?Patient presents with  ? Back Pain  ? ? ?Tara Jarvis is a 61 y.o. female who presents to the ED today with complaint of sudden onset, constant, sharp, worsening, left lower back pain with radiation down LLE that began 6 days ago.  Patient reports history of back surgery approximately 20 years ago.  Per chart review surgery in 1998.  She states that she believes she had a herniated disc at that time however is unsure of the specific surgery that she had done.  She works as a Emergency planning/management officer and states that she was helping a patient bathing get into her wheelchair when she had immediate pain in her back.  She denies hearing anything pop.  She has been taking over-the-counter medications as well as applying Bengay without relief.  States the pain was more severe today prompting ED visit.  She states that she has difficulty with prolonged sitting and therefore has had to stand up while urinating.  She denies any difficulty urinating/urinary retention or urinary or bowel incontinence.  No saddle anesthesia. No hx IVDA.  ? ?The history is provided by the patient and medical records.  ? ?  ? ?Home Medications ?Prior to Admission medications   ?Medication Sig Start Date End Date Taking? Authorizing Provider  ?methocarbamol (ROBAXIN) 500 MG tablet Take 1 tablet (500 mg total) by mouth 2 (two) times daily. 10/27/21  Yes Sharmayne Jablon, PA-C  ?predniSONE (STERAPRED UNI-PAK 21 TAB) 10 MG (21) TBPK tablet Take by mouth daily. Take 6 tabs by mouth daily  for 2 days, then 5 tabs for 2 days, then 4 tabs for 2 days, then 3 tabs for 2 days, 2 tabs for 2 days, then 1 tab by mouth daily for 2 days 10/27/21  Yes Eustaquio Maize, PA-C  ?Alcohol Swabs (ALCOHOL PREP) 70 % PADS 1 each by Does not apply route once a week. 10/18/20   Mullis, Kiersten P, DO  ?atorvastatin (LIPITOR)  40 MG tablet TAKE 1 TABLET BY MOUTH EVERY DAY 04/09/21   Eppie Gibson, MD  ?Blood Glucose Monitoring Suppl (ONETOUCH VERIO) w/Device KIT 1 Device by Does not apply route 2 (two) times daily. 07/17/20   Mullis, Kiersten P, DO  ?brimonidine-timolol (COMBIGAN) 0.2-0.5 % ophthalmic solution Place 1 drop into both eyes every 12 (twelve) hours.  10/08/17   [provider]  ?Dulaglutide (TRULICITY) 6.14 ER/1.5QM SOPN Inject 1.5 mg into the skin once a week. 08/19/21   Holley Bouche, MD  ?empagliflozin (JARDIANCE) 25 MG TABS tablet Take 1 tablet (25 mg total) by mouth daily. 12/17/20   Mullis, Kiersten P, DO  ?gabapentin (NEURONTIN) 300 MG capsule TAKE 1 CAPSULE BY MOUTH THREE TIMES A DAY 08/30/20   Wallene Huh, DPM  ?glucose blood (ONETOUCH VERIO) test strip Use as instructed 08/08/21   Holley Bouche, MD  ?hydrochlorothiazide (HYDRODIURIL) 25 MG tablet Take 1 tablet (25 mg total) by mouth daily. 09/04/21   Holley Bouche, MD  ?Lancet Devices (ONE TOUCH DELICA LANCING DEV) MISC 1 Device by Does not apply route 2 (two) times daily. 07/17/20   Mullis, Kiersten P, DO  ?latanoprost (XALATAN) 0.005 % ophthalmic solution Place 1 drop into both eyes at bedtime. 05/21/17   [provider]  ?metFORMIN (GLUCOPHAGE-XR) 500 MG 24 hr tablet Take 2 tablets (1,000 mg total) by mouth daily  with breakfast. 03/20/21   Zenia Resides, MD  ?metoprolol succinate (TOPROL-XL) 25 MG 24 hr tablet TAKE 0.5 TABLETS (12.5 MG TOTAL) BY MOUTH DAILY. PLEASE MAKE APPOINTMENT WITH PCP 09/18/21   Holley Bouche, MD  ?nicotine polacrilex (NICORETTE) 4 MG gum Take 1 each (4 mg total) by mouth as needed for smoking cessation. 08/19/21   Holley Bouche, MD  ?Jonetta Speak Lancets 80K MISC 1 Device by Does not apply route 2 (two) times daily. 07/17/20   Mullis, Kiersten P, DO  ?   ? ?Allergies    ?Lisinopril   ? ?Review of Systems   ?Review of Systems  ?Constitutional:  Negative for chills and fever.  ?Genitourinary:  Negative for  difficulty urinating.  ?Musculoskeletal:  Positive for arthralgias and back pain.  ?Neurological:  Negative for weakness and numbness.  ?All other systems reviewed and are negative. ? ?Physical Exam ?Updated Vital Signs ?BP (!) 154/91   Pulse 90   Temp 98.3 ?F (36.8 ?C) (Oral)   Resp 20   SpO2 95%  ?Physical Exam ?Vitals and nursing note reviewed.  ?Constitutional:   ?   Appearance: She is not ill-appearing.  ?   Comments: Tearful  ?HENT:  ?   Head: Normocephalic and atraumatic.  ?Eyes:  ?   Conjunctiva/sclera: Conjunctivae normal.  ?Cardiovascular:  ?   Rate and Rhythm: Normal rate and regular rhythm.  ?Pulmonary:  ?   Effort: Pulmonary effort is normal.  ?   Breath sounds: Normal breath sounds.  ?Abdominal:  ?   Palpations: Abdomen is soft.  ?   Tenderness: There is no abdominal tenderness.  ?Musculoskeletal:  ?   Cervical back: Neck supple.  ?   Comments: Healed surgical incision to lumbar spine without erythema, edema, or drainage. + midline L spine TTP with associated left paralumbar musculature TTP. ROM limited to back s/2 pain. Positive SLR on L side. Strength equal to BLEs. Sensation intact throughout. 2+ DP pulses bilaterally.   ?Skin: ?   General: Skin is warm and dry.  ?Neurological:  ?   Mental Status: She is alert.  ? ? ?ED Results / Procedures / Treatments   ?Labs ?(all labs ordered are listed, but only abnormal results are displayed) ?Labs Reviewed - No data to display ? ?EKG ?None ? ?Radiology ?CT Lumbar Spine Wo Contrast ? ?Result Date: 10/27/2021 ?CLINICAL DATA:  61 year old female with low back pain. Prior surgery. EXAM: CT LUMBAR SPINE WITHOUT CONTRAST TECHNIQUE: Multidetector CT imaging of the lumbar spine was performed without intravenous contrast administration. Multiplanar CT image reconstructions were also generated. RADIATION DOSE REDUCTION: This exam was performed according to the departmental dose-optimization program which includes automated exposure control, adjustment of the mA  and/or kV according to patient size and/or use of iterative reconstruction technique. COMPARISON:  None Available. FINDINGS: Segmentation: Normal. Alignment: Mild dextroconvex upper and levoconvex lower lumbar scoliosis with straightening of lumbar lordosis. Mild superimposed grade 1 anterolisthesis of L3 on L4 (2-3 mm). Vertebrae: Normal background bone mineralization. No acute osseous abnormality identified. Visible sacrum and SI joints appear intact. Paraspinal and other soft tissues: Minimal calcified atherosclerosis at the aortoiliac bifurcation. Normal caliber abdominal aorta. Negative visible other noncontrast abdominal viscera. Lumbar paraspinal soft tissues are within normal limits. Disc levels: T11-T12: Minimal disc bulging and calcification. No stenosis. T12-L1: Minimal posterior disc bulge and calcification. Mild facet hypertrophy. No stenosis. L1-L2:  Negative. L2-L3: Mild circumferential disc bulge, facet and ligament flavum hypertrophy. Mild spinal stenosis here in part related to epidural  lipomatosis (series 3, image 55). L3-L4: Mild anterolisthesis. Mild to moderate circumferential disc/pseudo disc. Moderate to severe facet and ligament flavum hypertrophy. Subtle vacuum phenomena in the posterior right spinal canal on series 3, image 73 might reflect synovial cyst. Combined with epidural lipomatosis there is moderate or severe spinal stenosis at this level. Mild to moderate left and mild right L3 foraminal stenosis. L4-L5: Disc space loss with bulky circumferential disc osteophyte complex. Broad-based posterior component. Moderate ligament flavum and moderate to severe facet hypertrophy. Possible sub ligamentous gas containing synovial cyst on the right series 3, image 89. Again, moderate to severe spinal stenosis with bilateral lateral recess stenosis (L5 nerve levels). Mild left but moderate to severe right L4 foraminal stenosis. L5-S1: Circumferential disc bulge and endplate spurring eccentric to  the left. Moderate facet hypertrophy greater on the right. No significant spinal stenosis but mild to moderate lateral recess stenosis greater on the left (left L5 nerve level). Mild left and mild to moderate right

## 2021-10-29 ENCOUNTER — Other Ambulatory Visit: Payer: Self-pay

## 2021-10-29 ENCOUNTER — Emergency Department (HOSPITAL_COMMUNITY): Payer: Medicare Other

## 2021-10-29 ENCOUNTER — Emergency Department (HOSPITAL_COMMUNITY)
Admission: EM | Admit: 2021-10-29 | Discharge: 2021-10-29 | Disposition: A | Discharge status: Home / Self Care | Payer: Medicare Other | Attending: Emergency Medicine | Admitting: Emergency Medicine

## 2021-10-29 ENCOUNTER — Encounter (HOSPITAL_COMMUNITY): Payer: Self-pay | Admitting: Emergency Medicine

## 2021-10-29 DIAGNOSIS — M48061 Spinal stenosis, lumbar region without neurogenic claudication: Secondary | ICD-10-CM | POA: Diagnosis not present

## 2021-10-29 DIAGNOSIS — M5442 Lumbago with sciatica, left side: Secondary | ICD-10-CM | POA: Diagnosis not present

## 2021-10-29 DIAGNOSIS — M549 Dorsalgia, unspecified: Secondary | ICD-10-CM | POA: Diagnosis not present

## 2021-10-29 DIAGNOSIS — E1165 Type 2 diabetes mellitus with hyperglycemia: Secondary | ICD-10-CM | POA: Insufficient documentation

## 2021-10-29 DIAGNOSIS — I10 Essential (primary) hypertension: Secondary | ICD-10-CM | POA: Insufficient documentation

## 2021-10-29 DIAGNOSIS — M545 Low back pain, unspecified: Secondary | ICD-10-CM | POA: Diagnosis not present

## 2021-10-29 DIAGNOSIS — M19041 Primary osteoarthritis, right hand: Secondary | ICD-10-CM | POA: Diagnosis not present

## 2021-10-29 DIAGNOSIS — Z79899 Other long term (current) drug therapy: Secondary | ICD-10-CM | POA: Diagnosis not present

## 2021-10-29 DIAGNOSIS — R739 Hyperglycemia, unspecified: Secondary | ICD-10-CM | POA: Diagnosis not present

## 2021-10-29 DIAGNOSIS — Z7984 Long term (current) use of oral hypoglycemic drugs: Secondary | ICD-10-CM | POA: Insufficient documentation

## 2021-10-29 DIAGNOSIS — M5136 Other intervertebral disc degeneration, lumbar region: Secondary | ICD-10-CM | POA: Diagnosis not present

## 2021-10-29 DIAGNOSIS — Z794 Long term (current) use of insulin: Secondary | ICD-10-CM | POA: Diagnosis not present

## 2021-10-29 DIAGNOSIS — M79641 Pain in right hand: Secondary | ICD-10-CM | POA: Insufficient documentation

## 2021-10-29 LAB — CBC
HCT: 42.4 % (ref 36.0–46.0)
Hemoglobin: 13.6 g/dL (ref 12.0–15.0)
MCH: 29.1 pg (ref 26.0–34.0)
MCHC: 32.1 g/dL (ref 30.0–36.0)
MCV: 90.6 fL (ref 80.0–100.0)
Platelets: 190 10*3/uL (ref 150–400)
RBC: 4.68 MIL/uL (ref 3.87–5.11)
RDW: 15.7 % — ABNORMAL HIGH (ref 11.5–15.5)
WBC: 8.2 10*3/uL (ref 4.0–10.5)
nRBC: 0 % (ref 0.0–0.2)

## 2021-10-29 LAB — URINALYSIS, ROUTINE W REFLEX MICROSCOPIC
Bilirubin Urine: NEGATIVE
Glucose, UA: >=500 mg/dL — AB
Hgb urine dipstick: NEGATIVE
Ketones, ur: NEGATIVE mg/dL
Nitrite: NEGATIVE
Protein, ur: NEGATIVE mg/dL
Specific Gravity, Urine: 1.028 (ref 1.005–1.030)
pH: 5 (ref 5.0–8.0)

## 2021-10-29 LAB — BASIC METABOLIC PANEL
Anion gap: 10 (ref 5–15)
BUN: 30 mg/dL — ABNORMAL HIGH (ref 6–20)
CO2: 23 mmol/L (ref 22–32)
Calcium: 9.2 mg/dL (ref 8.9–10.3)
Chloride: 100 mmol/L (ref 98–111)
Creatinine, Ser: 1.16 mg/dL — ABNORMAL HIGH (ref 0.44–1.00)
GFR, Estimated: 54 mL/min — ABNORMAL LOW (ref >60)
Glucose, Bld: 445 mg/dL — ABNORMAL HIGH (ref 70–99)
Potassium: 4.6 mmol/L (ref 3.5–5.1)
Sodium: 133 mmol/L — ABNORMAL LOW (ref 135–145)

## 2021-10-29 LAB — CBG MONITORING, ED
Glucose-Capillary: 364 mg/dL — ABNORMAL HIGH (ref 70–99)
Glucose-Capillary: 269 mg/dL — ABNORMAL HIGH (ref 70–99)

## 2021-10-29 LAB — I-STAT BETA HCG BLOOD, ED (MC, WL, AP ONLY): I-stat hCG, quantitative: 5.1 m[IU]/mL — ABNORMAL HIGH (ref <5)

## 2021-10-29 MED ORDER — GABAPENTIN 100 MG PO CAPS
200.0000 mg | ORAL_CAPSULE | Freq: Once | ORAL | Status: AC
  Administered 2021-10-29: 200 mg via ORAL
  Filled 2021-10-29: qty 2

## 2021-10-29 MED ORDER — INSULIN ASPART 100 UNIT/ML IJ SOLN
10.0000 [IU] | Freq: Once | INTRAMUSCULAR | Status: AC
  Administered 2021-10-29: 10 [IU] via SUBCUTANEOUS

## 2021-10-29 MED ORDER — OXYCODONE HCL 5 MG PO TABS: 5.0000 mg | ORAL_TABLET | Freq: Four times a day (QID) | ORAL | 0 refills | Status: DC | PRN

## 2021-10-29 MED ORDER — ONDANSETRON HCL 4 MG/2ML IJ SOLN
4.0000 mg | Freq: Once | INTRAMUSCULAR | Status: AC
  Administered 2021-10-29: 4 mg via INTRAVENOUS
  Filled 2021-10-29: qty 2

## 2021-10-29 MED ORDER — GABAPENTIN 300 MG PO CAPS: 300.0000 mg | ORAL_CAPSULE | Freq: Three times a day (TID) | ORAL | 0 refills | Status: AC

## 2021-10-29 MED ORDER — MORPHINE SULFATE (PF) 4 MG/ML IV SOLN
4.0000 mg | Freq: Once | INTRAVENOUS | Status: AC
  Administered 2021-10-29: 4 mg via INTRAVENOUS
  Filled 2021-10-29: qty 1

## 2021-10-29 MED ORDER — METHOCARBAMOL 500 MG PO TABS
500.0000 mg | ORAL_TABLET | Freq: Once | ORAL | Status: AC
Start: 2021-10-29 — End: 2021-10-29
  Administered 2021-10-29: 500 mg via ORAL
  Filled 2021-10-29: qty 1

## 2021-10-29 MED ORDER — KETOROLAC TROMETHAMINE 15 MG/ML IJ SOLN
15.0000 mg | Freq: Once | INTRAMUSCULAR | Status: AC
  Administered 2021-10-29: 15 mg via INTRAVENOUS
  Filled 2021-10-29: qty 1

## 2021-10-29 MED ORDER — SODIUM CHLORIDE 0.9 % IV BOLUS
500.0000 mL | Freq: Once | INTRAVENOUS | Status: AC
  Administered 2021-10-29: 500 mL via INTRAVENOUS

## 2021-10-29 NOTE — ED Triage Notes (Signed)
Pt reported to ED with c/o lower back pain. Was recently seen in this ED for the same. However,states her "right hand is now contracted". EMS also states pt's CBG was 267 en route to facility.   59m NS bolus administered en route. ?

## 2021-10-29 NOTE — Discharge Instructions (Addendum)
STOP taking prednisone. ? ?Continue using tylenol 1000 mg every 6 hours and use Aleve (naproxen) 2 tablets every 12 hours to help with pain. I recommend taking these medications regularly. ? ?You can also take Gabapentin 300 mg 3 times daily to help with pain and can continue to use robaxin twice daily as needed for muscle spasm. This medication can cause drowsiness, do not take before driving. ? ?For severe pain despite taking these medications you may take 1 tablet of prescribed 5 mg oxycodone every 6 hours.  Use this medication with caution as it can cause drowsiness, and can be addictive.  Only use this for severe pain after taking other medications. ? ?You can also apply ice and heat to help with pain. ? ?Please make sure you are taking your metformin to help control your blood sugar, and please check your blood sugar at least once daily.  If it continues to run high please call your primary care doctor. ? ?Use stretches and exercises provided today. Try and get up and move frequently to avoid worsening stiff ness and pain.  ? ?On your CT scan from 2 days ago you have multiple areas of disc bulging and spinal stenosis and so I fully expect that you will continue to have pain.  Please follow-up with [facility]surgery on Monday as planned. ? ?The x-rays of your right hand do not show any fracture or dislocation.  It may be a sprain or strain of the joint, medications for your back should help with this as well and you can continue to apply ice.  If it is not improving I would like for you to follow-up with Dr. Lenon Curt with hand surgery for further evaluation. ? ?If you develop numbness, weakness, loss of control of your bowels or bladder or other new or concerning symptoms return to the emergency department for reevaluation. ?

## 2021-10-31 NOTE — Progress Notes (Signed)
? ? ?  SUBJECTIVE:  ? ?CHIEF COMPLAINT / HPI: hospital follow up  ? ?Patient presents for hospital follow up after evaluation in the ED for lower back pain. CT at the time showed Scoliosis, L3-L4, Severe spinal stenosis L3-L4 and L4-L5 with lateral recess stenosis at L5 and severe foraminal stenosis at L4 with questionable degenerative synovial cyst at these levels, L5-S1 moderate stenosis . Her medical hx is significnt for past back surgery for herniated disc. She was found to have positive straight leg test on examination in the ED. She was treated for pain and started on steroid and antiinflammatory medication at the time of discharge.  She was treated with morphine, Toradol, Robaxin and gabapentin. ? ? ?Since Monday afternoon she has been experiencing numbness in her left leg. She has been taking the prednisone prescribed. She reports having more difficulty with getting out of bed and bending forward due to pain and stiffness. She states that she has not been able to return to work since the injury. 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  ? ?Today she reports she had minimal relief with the pain medicine prescribed from the ED.  She reports that yesterday she started to notice left leg numbness and stiffness.  She has started walking with a cane.  Patient denies urinary or bowel incontinence.  She states that she has an appointment on 11/04/2021 with a neuro surgeon at 1030. ? ?PERTINENT  PMH / PSH:  ?Lumbar herniated disc ? ?OBJECTIVE:  ? ?BP (!) 128/99   Pulse 78   Ht '5\' 7"'$  (1.702 m)   Wt 220 lb (99.8 kg)   SpO2 99%   BMI 34.46 kg/m?   ?Back: ?Patient in back brace, uncomfortable appearing, seated on exam table edge  ?No gross deformity, scoliosis. ?+TTP along bilateral lumbar ?Limited ROM with flexion and extension  ?Strength LEs 5/5 all muscle groups.   ?Negative SLRs. ?Sensation intact to light touch bilaterally. ? ? ?ASSESSMENT/PLAN:   ? ?Acute left-sided low back pain with left-sided sciatica ?Encouraged the patient to follow-up with scheduled neurosurgery appointment ?We will provide refills for pain medication to get patient to her neurosurgery appointment ?Recommended she continue to wear back brace to help with support ?We will provide a letter for patient to remain out of work with further recommendation for time off at discretion of neurosurgery based on their evaluation ?Patient administered 30 mg Toradol dose in clinic today ?  ? ? ?Eulis Foster, MD ?[facility]  ?

## 2021-11-01 ENCOUNTER — Ambulatory Visit (INDEPENDENT_AMBULATORY_CARE_PROVIDER_SITE_OTHER): Payer: Medicare Other | Admitting: Family Medicine

## 2021-11-01 ENCOUNTER — Other Ambulatory Visit: Payer: Self-pay

## 2021-11-01 ENCOUNTER — Telehealth: Payer: Self-pay

## 2021-11-01 VITALS — BP 128/99 | HR 78 | Ht 67.0 in | Wt 220.0 lb

## 2021-11-01 DIAGNOSIS — M5442 Lumbago with sciatica, left side: Secondary | ICD-10-CM

## 2021-11-01 DIAGNOSIS — E119 Type 2 diabetes mellitus without complications: Secondary | ICD-10-CM | POA: Diagnosis not present

## 2021-11-01 LAB — GLUCOSE, POCT (MANUAL RESULT ENTRY): POC Glucose: 205 mg/dl — AB (ref 70–99)

## 2021-11-01 MED ORDER — KETOROLAC TROMETHAMINE 60 MG/2ML IM SOLN
30.0000 mg | Freq: Once | INTRAMUSCULAR | Status: AC
  Administered 2021-11-01: 30 mg via INTRAMUSCULAR

## 2021-11-01 MED ORDER — OXYCODONE HCL 5 MG PO TABS: 5.0000 mg | ORAL_TABLET | Freq: Four times a day (QID) | ORAL | 0 refills | Status: AC | PRN

## 2021-11-01 MED ORDER — KETOROLAC TROMETHAMINE 60 MG/2ML IM SOLN: 60.0000 mg | Freq: Once | INTRAMUSCULAR | Status: DC

## 2021-11-01 NOTE — Patient Instructions (Addendum)
We will provide refills of medication to help with your back pain.  I recommended she try to keep moving your lower extremities to prevent stiffness. ?You can continue to wear your back brace for comfort and support. ? ?I recommend following up as scheduled with your neurosurgeon to further evaluate your back. ? ?You may need urgent care over the weekend if you are not unable to move your legs or you develop numbness and inability to hold your bowel or bladder.  ?

## 2021-11-01 NOTE — Telephone Encounter (Signed)
Patient calls nurse line requesting a note for work.  ? ?Patient reports she does not see her spine specialist until 5/22. ? ?Patient is requesting a work excuse from today 5/12-5/22. ? ?Will forward to provider who saw patient.  ?

## 2021-11-01 NOTE — Telephone Encounter (Signed)
Agree with work for note until follow up appointment.Will write letter and save in communications. Family or patient can pick up letter when available.  ? ?Will place signed letter at front reception desk for pick up.  ? ?Eulis Foster, MD ?[facility], PGY-3 ?[phone]  ? ?

## 2021-11-01 NOTE — Assessment & Plan Note (Signed)
Encouraged the patient to follow-up with scheduled neurosurgery appointment ?We will provide refills for pain medication to get patient to her neurosurgery appointment ?Recommended she continue to wear back brace to help with support ?We will provide a letter for patient to remain out of work with further recommendation for time off at discretion of neurosurgery based on their evaluation ?Patient administered 30 mg Toradol dose in clinic today ? ?

## 2021-11-05 ENCOUNTER — Other Ambulatory Visit: Payer: Self-pay | Admitting: Student

## 2021-11-05 ENCOUNTER — Other Ambulatory Visit: Payer: Self-pay

## 2021-11-05 DIAGNOSIS — E1165 Type 2 diabetes mellitus with hyperglycemia: Secondary | ICD-10-CM

## 2021-11-05 MED ORDER — ONETOUCH VERIO W/DEVICE KIT: 1.0000 | PACK | Freq: Two times a day (BID) | 0 refills | Status: DC

## 2021-11-05 MED ORDER — ONETOUCH DELICA LANCETS 33G MISC: 1.0000 | Freq: Two times a day (BID) | 11 refills | Status: DC

## 2021-11-05 NOTE — ED Provider Notes (Signed)
?[facility] ?Provider Note ? ? ?CSN: [id number] ?Arrival date & time: 10/29/21  0123 ? ?  ? ?History ? ?Chief Complaint  ?Patient presents with  ? Back Pain  ? Hyperglycemia  ? ? ?Tara Jarvis is a 61 y.o. female. ? ?Tara Jarvis is a 61 y.o. female with a hx of HTN and diabetes, who returns to ED via EMS for evaluation of worsening low back pain and hyperglycemia. Pt was seen in ED for back pain on 5/7, at that time she had a CT of the lumbar spine that showed multilevel degenerative disc disease and spinal stenosis. Pt reports her pain initially improved with tx in the ED and she was able to walk at discharge but returned home and spent most of the next day I bed and then when she tried to get up and move pain worsened and kept her from sleeping last night. Pain radiates down the left leg. Pt denies numbness, weakness or loss of bowel or bladder control. Pt reports she sometimes cannot make it to the bathroom in time due to pain with movement. Has had little relief of pain with tylenol, steroid and muscle relaxer and had elevated CBG with EMS. Pt later reported that she had not been taking her metformin because if causes some diarrhea and it is painful to get up to the bathroom. She has a follow up appointment with neurosurgery on Monday. No fevers, urinary symptoms or abdominal pain. ? ?The history is provided by the patient, a relative and medical records.  ?Back Pain ?Associated symptoms: no abdominal pain, no chest pain, no dysuria, no fever, no numbness and no weakness   ?Hyperglycemia ?Associated symptoms: no abdominal pain, no chest pain, no dysuria, no fever, no nausea, no shortness of breath, no vomiting and no weakness   ? ?  ? ?Home Medications ?Prior to Admission medications   ?Medication Sig Start Date End Date Taking? Authorizing Provider  ?gabapentin (NEURONTIN) 300 MG capsule Take 1 capsule (300 mg total) by mouth 3 (three) times daily. 10/29/21  Yes  Jacqlyn Larsen, PA-C  ?Alcohol Swabs (ALCOHOL PREP) 70 % PADS 1 each by Does not apply route once a week. 10/18/20   Mullis, Kiersten P, DO  ?atorvastatin (LIPITOR) 40 MG tablet TAKE 1 TABLET BY MOUTH EVERY DAY 04/09/21   Eppie Gibson, MD  ?Blood Glucose Monitoring Suppl (ONETOUCH VERIO) w/Device KIT 1 Device by Does not apply route 2 (two) times daily. 07/17/20   Mullis, Kiersten P, DO  ?brimonidine-timolol (COMBIGAN) 0.2-0.5 % ophthalmic solution Place 1 drop into both eyes every 12 (twelve) hours.  10/08/17   [provider]  ?Dulaglutide (TRULICITY) 7.34 KA/7.6OT SOPN Inject 1.5 mg into the skin once a week. 08/19/21   Holley Bouche, MD  ?empagliflozin (JARDIANCE) 25 MG TABS tablet Take 1 tablet (25 mg total) by mouth daily. 12/17/20   Mullis, Kiersten P, DO  ?glucose blood (ONETOUCH VERIO) test strip Use as instructed 08/08/21   Holley Bouche, MD  ?hydrochlorothiazide (HYDRODIURIL) 25 MG tablet Take 1 tablet (25 mg total) by mouth daily. 09/04/21   Holley Bouche, MD  ?Lancet Devices (ONE TOUCH DELICA LANCING DEV) MISC 1 Device by Does not apply route 2 (two) times daily. 07/17/20   Mullis, Kiersten P, DO  ?latanoprost (XALATAN) 0.005 % ophthalmic solution Place 1 drop into both eyes at bedtime. 05/21/17   [provider]  ?metFORMIN (GLUCOPHAGE-XR) 500 MG 24 hr tablet Take 2 tablets (1,000  mg total) by mouth daily with breakfast. 03/20/21   Zenia Resides, MD  ?methocarbamol (ROBAXIN) 500 MG tablet Take 1 tablet (500 mg total) by mouth 2 (two) times daily. 10/27/21   Eustaquio Maize, PA-C  ?metoprolol succinate (TOPROL-XL) 25 MG 24 hr tablet TAKE 0.5 TABLETS (12.5 MG TOTAL) BY MOUTH DAILY. PLEASE MAKE APPOINTMENT WITH PCP 09/18/21   Holley Bouche, MD  ?nicotine polacrilex (NICORETTE) 4 MG gum Take 1 each (4 mg total) by mouth as needed for smoking cessation. 08/19/21   Holley Bouche, MD  ?Jonetta Speak Lancets 54M MISC 1 Device by Does not apply route 2 (two) times daily. 07/17/20    Mullis, Kiersten P, DO  ?   ? ?Allergies    ?Lisinopril   ? ?Review of Systems   ?Review of Systems  ?Constitutional:  Negative for chills and fever.  ?HENT: Negative.    ?Respiratory:  Negative for shortness of breath.   ?Cardiovascular:  Negative for chest pain.  ?Gastrointestinal:  Negative for abdominal pain, constipation, diarrhea, nausea and vomiting.  ?Genitourinary:  Negative for dysuria, flank pain, frequency and hematuria.  ?Musculoskeletal:  Positive for back pain. Negative for arthralgias, gait problem, joint swelling, myalgias and neck pain.  ?Skin:  Negative for color change, rash and wound.  ?Neurological:  Negative for weakness and numbness.  ? ?Physical Exam ?Updated Vital Signs ?BP (!) 162/87   Pulse 68   Temp 98.3 ?F (36.8 ?C) (Oral)   Resp (!) 22   SpO2 98%  ?Physical Exam ?Vitals and nursing note reviewed.  ?Constitutional:   ?   General: She is not in acute distress. ?   Appearance: She is well-developed. She is not diaphoretic.  ?HENT:  ?   Head: Atraumatic.  ?Eyes:  ?   General:     ?   Right eye: No discharge.     ?   Left eye: No discharge.  ?Cardiovascular:  ?   Rate and Rhythm: Normal rate and regular rhythm.  ?   Pulses:     ?     Radial pulses are 2+ on the right side and 2+ on the left side.  ?     Dorsalis pedis pulses are 2+ on the right side and 2+ on the left side.  ?     Posterior tibial pulses are 2+ on the right side and 2+ on the left side.  ?   Heart sounds: Normal heart sounds.  ?Pulmonary:  ?   Effort: Pulmonary effort is normal. No respiratory distress.  ?   Breath sounds: Normal breath sounds.  ?Abdominal:  ?   General: Bowel sounds are normal. There is no distension.  ?   Palpations: Abdomen is soft. There is no mass.  ?   Tenderness: There is no abdominal tenderness. There is no guarding.  ?   Comments: Abdomen soft, nondistended, nontender to palpation in all quadrants without guarding or peritoneal signs, no CVA tenderness bilaterally  ?Musculoskeletal:     ?    General: Tenderness present.  ?   Cervical back: Neck supple.  ?   Comments: Tenderness to palpation over left low back and buttock.  Pain made worse with range of motion of the lower extremities, positive straight leg raise on the left. No overlying skin changes  ?Skin: ?   General: Skin is warm and dry.  ?   Capillary Refill: Capillary refill takes less than 2 seconds.  ?Neurological:  ?   Mental Status: She is  alert and oriented to person, place, and time.  ?   Comments: Alert, clear speech, following commands. ?Moving all extremities without difficulty. ?Bilateral lower extremities with 5/5 strength in proximal and distal muscle groups and with dorsi and plantar flexion. ?Sensation intact in bilateral lower extremities. ?2+ patellar DTRs bilaterally. ?Ambulatory with steady gait  ?Psychiatric:     ?   Behavior: Behavior normal.  ? ? ?ED Results / Procedures / Treatments   ?Labs ?(all labs ordered are listed, but only abnormal results are displayed) ?Labs Reviewed  ?BASIC METABOLIC PANEL - Abnormal; Notable for the following components:  ?    Result Value  ? Sodium 133 (*)   ? Glucose, Bld 445 (*)   ? BUN 30 (*)   ? Creatinine, Ser 1.16 (*)   ? GFR, Estimated 54 (*)   ? All other components within normal limits  ?CBC - Abnormal; Notable for the following components:  ? RDW 15.7 (*)   ? All other components within normal limits  ?URINALYSIS, ROUTINE W REFLEX MICROSCOPIC - Abnormal; Notable for the following components:  ? APPearance HAZY (*)   ? Glucose, UA >=500 (*)   ? Leukocytes,Ua LARGE (*)   ? Bacteria, UA RARE (*)   ? All other components within normal limits  ?CBG MONITORING, ED - Abnormal; Notable for the following components:  ? Glucose-Capillary 364 (*)   ? All other components within normal limits  ?I-STAT BETA HCG BLOOD, ED ([facility], [facility], [facility] ONLY) - Abnormal; Notable for the following components:  ? I-stat hCG, quantitative 5.1 (*)   ? All other components within normal limits  ?CBG MONITORING, ED -  Abnormal; Notable for the following components:  ? Glucose-Capillary 269 (*)   ? All other components within normal limits  ? ? ?EKG ?None ? ?Radiology ?No results found. ? ?Procedures ?Procedures  ? ? ?Medications Ordered

## 2021-11-05 NOTE — Telephone Encounter (Signed)
VM left informing patient of letter up front for pick up.  ?

## 2021-11-07 MED ORDER — EMPAGLIFLOZIN 25 MG PO TABS: 25.0000 mg | ORAL_TABLET | Freq: Every day | ORAL | 3 refills | Status: DC

## 2021-11-11 DIAGNOSIS — M47816 Spondylosis without myelopathy or radiculopathy, lumbar region: Secondary | ICD-10-CM | POA: Diagnosis not present

## 2021-11-11 DIAGNOSIS — M5416 Radiculopathy, lumbar region: Secondary | ICD-10-CM | POA: Diagnosis not present

## 2021-11-12 ENCOUNTER — Other Ambulatory Visit: Payer: Self-pay | Admitting: Neurological Surgery

## 2021-11-12 DIAGNOSIS — M5416 Radiculopathy, lumbar region: Secondary | ICD-10-CM

## 2021-11-26 ENCOUNTER — Encounter: Payer: Self-pay | Admitting: *Deleted

## 2021-12-02 ENCOUNTER — Ambulatory Visit
Admission: RE | Admit: 2021-12-02 | Discharge: 2021-12-02 | Disposition: A | Discharge status: Home / Self Care | Payer: Medicare Other | Source: Ambulatory Visit | Attending: Neurological Surgery | Admitting: Neurological Surgery

## 2021-12-02 DIAGNOSIS — M48061 Spinal stenosis, lumbar region without neurogenic claudication: Secondary | ICD-10-CM | POA: Diagnosis not present

## 2021-12-02 DIAGNOSIS — M545 Low back pain, unspecified: Secondary | ICD-10-CM | POA: Diagnosis not present

## 2021-12-02 DIAGNOSIS — M5416 Radiculopathy, lumbar region: Secondary | ICD-10-CM

## 2021-12-04 ENCOUNTER — Encounter: Payer: Self-pay | Admitting: Family Medicine

## 2021-12-04 ENCOUNTER — Ambulatory Visit (INDEPENDENT_AMBULATORY_CARE_PROVIDER_SITE_OTHER): Payer: Medicare Other | Admitting: Family Medicine

## 2021-12-04 DIAGNOSIS — E1165 Type 2 diabetes mellitus with hyperglycemia: Secondary | ICD-10-CM

## 2021-12-04 DIAGNOSIS — E119 Type 2 diabetes mellitus without complications: Secondary | ICD-10-CM | POA: Diagnosis not present

## 2021-12-04 LAB — POCT GLYCOSYLATED HEMOGLOBIN (HGB A1C): HbA1c, POC (controlled diabetic range): 10.2 % — AB (ref 0.0–7.0)

## 2021-12-04 MED ORDER — TRULICITY 0.75 MG/0.5ML ~~LOC~~ SOAJ: 1.5000 mg | SUBCUTANEOUS | 1 refills | Status: DC

## 2021-12-04 MED ORDER — ONETOUCH VERIO W/DEVICE KIT: 1.0000 | PACK | Freq: Two times a day (BID) | 0 refills | Status: AC

## 2021-12-04 MED ORDER — ONETOUCH DELICA LANCETS 33G MISC: 1.0000 | Freq: Two times a day (BID) | 11 refills | Status: AC

## 2021-12-04 MED ORDER — ONETOUCH VERIO VI STRP
ORAL_STRIP | 12 refills | Status: AC
Start: 2021-12-04 — End: ?

## 2021-12-04 NOTE — Progress Notes (Signed)
    SUBJECTIVE:   CHIEF COMPLAINT / HPI: DM  Diabetes Current Regimen: jardiance, metformin; states that she lost her device to measure blood glucose and lost her trulicity  CBGs: has not been checking for the last 3 months she estimates  Last A1c:  Lab Results  Component Value Date   HGBA1C 10.2 (A) 12/04/2021    Reports polyuria (jardiance), polydipsia, Denies  hypoglycemia: Last Eye Exam: 09/03/21   PERTINENT  PMH / PSH:  DM  Tobacco use  HfrED  HTN   OBJECTIVE:   There were no vitals taken for this visit.  Physical Exam Constitutional:      Appearance: She is obese. She is not ill-appearing.  HENT:     Right Ear: External ear normal.     Left Ear: External ear normal.     Nose: Nose normal.     Mouth/Throat:     Pharynx: Oropharynx is clear.  Cardiovascular:     Rate and Rhythm: Normal rate and regular rhythm.  Pulmonary:     Effort: Pulmonary effort is normal.  Abdominal:     General: Bowel sounds are normal.     Palpations: Abdomen is soft.  Musculoskeletal:     Cervical back: Normal range of motion.  Skin:    General: Skin is warm.  Neurological:     General: No focal deficit present.     Mental Status: She is alert and oriented to person, place, and time.      ASSESSMENT/PLAN:   Type 2 diabetes mellitus without complication, without long-term current use of insulin ([place]) Will provide prescription for replacement glucose meter including lancets, testing strips We will provide refills for Trulicity 1.5 mg weekly as patient states that she has been out for several months We will check hemoglobin A1c today     Eulis Foster, MD [place]

## 2021-12-04 NOTE — Patient Instructions (Signed)
I have submitted refills for your Trulicity, testing strips, and a new glucose meter.  Recommend that you check your blood sugars in the morning before having any meals for your fasting levels.  I also recommend checking your blood glucose 2 hours after your largest meal in the evenings.    Please follow-up with your PCP in 2-4 weeks.  Today we will recheck your hemoglobin A1c to see your average blood sugars over the last 3 months.

## 2021-12-04 NOTE — Assessment & Plan Note (Signed)
Will provide prescription for replacement glucose meter including lancets, testing strips We will provide refills for Trulicity 1.5 mg weekly as patient states that she has been out for several months We will check hemoglobin A1c today

## 2021-12-05 ENCOUNTER — Telehealth: Payer: Self-pay

## 2021-12-05 NOTE — Telephone Encounter (Signed)
Patient calls nurse line regarding decreased appetite this morning. Patient reports receiving first injection of Trulicity yesterday. Patient is not having any other symptoms.   Advised patient that this is a common side effect of Trulicity. Provided with return precautions. Patient still needs to pick up meter from pharmacy. Patient will then keep track of blood sugar readings and keep log.   Patient has follow up appointment with PCP on 7/5.   Answered all questions.   Talbot Grumbling, RN

## 2021-12-16 DIAGNOSIS — M47816 Spondylosis without myelopathy or radiculopathy, lumbar region: Secondary | ICD-10-CM | POA: Diagnosis not present

## 2021-12-16 DIAGNOSIS — M5416 Radiculopathy, lumbar region: Secondary | ICD-10-CM | POA: Diagnosis not present

## 2021-12-17 DIAGNOSIS — H401132 Primary open-angle glaucoma, bilateral, moderate stage: Secondary | ICD-10-CM | POA: Diagnosis not present

## 2021-12-25 ENCOUNTER — Ambulatory Visit: Payer: Medicare Other | Admitting: Student

## 2022-01-02 ENCOUNTER — Ambulatory Visit: Payer: Medicare Other | Admitting: Family Medicine

## 2022-03-05 ENCOUNTER — Other Ambulatory Visit: Payer: Self-pay | Admitting: Student

## 2022-03-05 DIAGNOSIS — E1165 Type 2 diabetes mellitus with hyperglycemia: Secondary | ICD-10-CM

## 2022-03-05 MED ORDER — TRULICITY 0.75 MG/0.5ML ~~LOC~~ SOAJ: 1.5000 mg | SUBCUTANEOUS | 0 refills | Status: AC

## 2022-03-09 ENCOUNTER — Other Ambulatory Visit: Payer: Self-pay | Admitting: Student

## 2022-03-09 DIAGNOSIS — I10 Essential (primary) hypertension: Secondary | ICD-10-CM

## 2022-03-19 DIAGNOSIS — H401132 Primary open-angle glaucoma, bilateral, moderate stage: Secondary | ICD-10-CM | POA: Diagnosis not present

## 2022-05-23 ENCOUNTER — Ambulatory Visit: Payer: Medicare Other | Admitting: Student

## 2022-06-03 ENCOUNTER — Other Ambulatory Visit: Payer: Self-pay | Admitting: Student

## 2022-06-03 ENCOUNTER — Other Ambulatory Visit: Payer: Self-pay | Admitting: Family Medicine

## 2022-06-03 DIAGNOSIS — I10 Essential (primary) hypertension: Secondary | ICD-10-CM

## 2022-06-03 DIAGNOSIS — E118 Type 2 diabetes mellitus with unspecified complications: Secondary | ICD-10-CM

## 2022-06-03 NOTE — Telephone Encounter (Signed)
Requested medication (s) are due for refill today: routing for review  Requested medication (s) are on the active medication list: yes  Last refill:  04/09/21  Future visit scheduled: no  Notes to clinic:  Unable to refill per protocol, last refill by another provider.  Routing for review     Requested Prescriptions  Pending Prescriptions Disp Refills   atorvastatin (LIPITOR) 40 MG tablet [Pharmacy Med Name: ATORVASTATIN 40 MG TABLET] 90 tablet 1    Sig: TAKE 1 TABLET BY MOUTH EVERY DAY     Cardiovascular:  Antilipid - Statins Failed - 06/03/2022 10:13 AM      Failed - Valid encounter within last 12 months    Recent Outpatient Visits           3 years ago Screening-pulmonary TB   [facility] at [facility], Arlie Solomons, MD   3 years ago PPD screening test   Primary Care at [facility], Arlie Solomons, MD   4 years ago Encounter for PPD skin test reading   Primary Care at [place], PA-C   4 years ago Screening for tuberculosis   Primary Care at [place], PA-C              Failed - Lipid Panel in normal range within the last 12 months    Cholesterol, Total  Date Value Ref Range Status  07/17/2020 194 100 - 199 mg/dL Final   LDL Chol Calc (NIH)  Date Value Ref Range Status  07/17/2020 104 (H) 0 - 99 mg/dL Final   HDL  Date Value Ref Range Status  07/17/2020 54 >39 mg/dL Final   Triglycerides  Date Value Ref Range Status  07/17/2020 208 (H) 0 - 149 mg/dL Final         Passed - Patient is not pregnant

## 2022-06-11 NOTE — Progress Notes (Signed)
[facility] Quality Team Note  Name: Tara Jarvis Date of Birth: [birth year] MRN: [id number] Date: 06/11/2022  [facility] Quality Team has reviewed this patient's chart, please see recommendations below:  [facility] Quality Other; (KED [facility] MICROALBUMIN CREATININE RATIO TEST COMPLETED BEFORE END OF YEAR FOR GAP CLOSURE. PATIENT HAS APPT 06/19/2022 CAN THIS BE COMPLETED AT THIS TIME? PATIENT ALSO NEEDS HEMOGLOBIN A1C COMPLETED BEFORE END OF YEAR WITH A RESULT OF 8.0 OR LESS FOR GAP CLOSURE.)

## 2022-06-19 ENCOUNTER — Ambulatory Visit (INDEPENDENT_AMBULATORY_CARE_PROVIDER_SITE_OTHER): Payer: Medicare Other | Admitting: Student

## 2022-06-19 VITALS — BP 118/80 | HR 90 | Temp 98.3°F | Wt 194.0 lb

## 2022-06-19 DIAGNOSIS — I10 Essential (primary) hypertension: Secondary | ICD-10-CM

## 2022-06-19 DIAGNOSIS — I5020 Unspecified systolic (congestive) heart failure: Secondary | ICD-10-CM | POA: Diagnosis not present

## 2022-06-19 DIAGNOSIS — E1169 Type 2 diabetes mellitus with other specified complication: Secondary | ICD-10-CM

## 2022-06-19 DIAGNOSIS — Z23 Encounter for immunization: Secondary | ICD-10-CM

## 2022-06-19 DIAGNOSIS — E785 Hyperlipidemia, unspecified: Secondary | ICD-10-CM | POA: Diagnosis not present

## 2022-06-19 DIAGNOSIS — E119 Type 2 diabetes mellitus without complications: Secondary | ICD-10-CM | POA: Diagnosis not present

## 2022-06-19 LAB — POCT GLYCOSYLATED HEMOGLOBIN (HGB A1C): HbA1c, POC (controlled diabetic range): 6.7 % (ref 0.0–7.0)

## 2022-06-19 MED ORDER — ATORVASTATIN CALCIUM 40 MG PO TABS: 40.0000 mg | ORAL_TABLET | Freq: Every day | ORAL | 0 refills | Status: DC

## 2022-06-19 MED ORDER — METFORMIN HCL ER 500 MG PO TB24: 1000.0000 mg | ORAL_TABLET | Freq: Every day | ORAL | 3 refills | Status: AC

## 2022-06-19 MED ORDER — EMPAGLIFLOZIN 25 MG PO TABS: 25.0000 mg | ORAL_TABLET | Freq: Every day | ORAL | 3 refills | Status: AC

## 2022-06-19 MED ORDER — HYDROCHLOROTHIAZIDE 25 MG PO TABS: 25.0000 mg | ORAL_TABLET | Freq: Every day | ORAL | 1 refills | Status: DC

## 2022-06-19 NOTE — Assessment & Plan Note (Signed)
Patient due for lipid panel today, reports good compliance with medication. -cont Atorvostatin 40 mg -Lipid panel

## 2022-06-19 NOTE — Assessment & Plan Note (Signed)
Patient A1c 6.7 today and is at goal. Patient reports good compliance with medications. Patient foot exam normal.  -Continue metformin 1000 mg BID, continue Jardiance 25, continue Trulicity 1.5 mg wkly -Amb Ref to Optho for eye exam -Urine microalbuimin -F/u 6 months

## 2022-06-19 NOTE — Assessment & Plan Note (Addendum)
Patient last saw Cardiology in 2021 w/ Echo in 2020 EF 40-45%. Will have patient f/u with cardiology for management of HF, as she is not on medication for it at this time. Patient euvolemic on exam today w/o complaints.  -Make appt w/ Cards

## 2022-06-19 NOTE — Patient Instructions (Signed)
It was great to see you! Thank you for allowing me to participate in your care!  I recommend that you always bring your medications to each appointment as this makes it easy to ensure we are on the correct medications and helps [place] not miss when refills are needed.  Our plans for today:  - Your diabetes is well controlled. Your A1c is 6.7 and we want it under 7, continue taking your meds - Your blood pressure is well controlled, continue your meds - You are DUE for:  Pap smear - Make an appointment to be seen early in the new year  Colonoscopy - Someone will call you about scheduling  We are checking some labs today, I will call you if they are abnormal will send you a MyChart message or a letter if they are normal.  If you do not hear about your labs in the next 2 weeks please let us know.  Take care and seek immediate care sooner if you develop any concerns.   Dr. Holley Bouche, MD [place]

## 2022-06-19 NOTE — Progress Notes (Signed)
SUBJECTIVE:   CHIEF COMPLAINT / HPI:   HTN BP Readings from Last 3 Encounters:  06/19/22 118/80  11/01/21 (!) 128/99  10/29/21 (!) 162/87  Meds:HCTZ 25 Compliance: Taking daily  DM Lab Results  Component Value Date   HGBA1C 6.7 06/19/2022  Meds:Jardiance 25 mg, Metformin XR 1610 mg BID, Trulicity 1.5 mg CBG: Not checking Compliance:  Eye: Referral placed Urine: Collected Foot Exam: performed  HLD Meds: Atorvastatin 40 mg Compliance: Daily  Health [facility]: Cervical Cancer: Pap smear Due Colon Cancer: Colonoscopy Due Breast Cancer: UTD, f/u 1 yr   PERTINENT  PMH / PSH: HTN, HLD, DM    OBJECTIVE:  BP 118/80   Pulse 90   Temp 98.3 F (36.8 C)   Wt 194 lb (88 kg)   SpO2 97%   BMI 30.38 kg/m  Physical Exam Constitutional:      General: She is not in acute distress.    Appearance: Normal appearance.  Cardiovascular:     Rate and Rhythm: Normal rate and regular rhythm.     Pulses: Normal pulses.     Heart sounds: Normal heart sounds. No murmur heard.    No friction rub. No gallop.  Pulmonary:     Effort: Pulmonary effort is normal. No respiratory distress.     Breath sounds: Normal breath sounds. No stridor. No wheezing, rhonchi or rales.  Abdominal:     General: Bowel sounds are normal. There is no distension.     Palpations: Abdomen is soft. There is no mass.     Tenderness: There is no abdominal tenderness. There is no guarding.  Neurological:     Mental Status: She is alert.      ASSESSMENT/PLAN:  Type 2 diabetes mellitus without complication, without long-term current use of insulin ([facility]) Assessment & Plan: Patient A1c 6.7 today and is at goal. Patient reports good compliance with medications. Patient foot exam normal.  -Continue metformin 1000 mg BID, continue Jardiance 25, continue Trulicity 1.5 mg wkly -Amb Ref to Optho for eye exam -Urine microalbuimin -F/u 6 months   Orders: -     Ambulatory referral to Ophthalmology -     Microalbumin /  creatinine urine ratio; Future -     POCT glycosylated hemoglobin (Hb A1C) -     Lipid panel -     metFORMIN HCl ER; Take 2 tablets (1,000 mg total) by mouth daily with breakfast.  Dispense: 180 tablet; Refill: 3 -     Atorvastatin Calcium; Take 1 tablet (40 mg total) by mouth daily. MAKE APPOINTMENT TO BE SEEN  Dispense: 60 tablet; Refill: 0 -     Empagliflozin; Take 1 tablet (25 mg total) by mouth daily.  Dispense: 90 tablet; Refill: 3  Primary hypertension Assessment & Plan: BP at goal today, 118/80, and good compliance with medication.  -Cont HCTZ 25  Orders: -     hydroCHLOROthiazide; Take 1 tablet (25 mg total) by mouth daily.  Dispense: 90 tablet; Refill: 1  Need for immunization against influenza -     Flu Vaccine QUAD 64moIM (Fluarix, Fluzone & Alfiuria Quad PF)  Hyperlipidemia associated with type 2 diabetes mellitus ([place] Assessment & Plan: Patient due for lipid panel today, reports good compliance with medication. -cont Atorvostatin 40 mg -Lipid panel  Orders: -     Lipid panel -     Atorvastatin Calcium; Take 1 tablet (40 mg total) by mouth daily. MAKE APPOINTMENT TO BE SEEN  Dispense: 60 tablet; Refill: 0  HFrEF (heart failure  with reduced ejection fraction) [facility]) Assessment & Plan: Patient last saw Cardiology in 2021 w/ Echo in 2020 EF 40-45%. Will have patient f/u with cardiology for management of HF, as she is not on medication for it at this time. Patient euvolemic on exam today w/o complaints.  -Make appt w/ Cards    No follow-ups on file. Holley Bouche, MD 06/19/2022, 5:40 PM PGY-2, [place]

## 2022-06-19 NOTE — Assessment & Plan Note (Signed)
BP at goal today, 118/80, and good compliance with medication.  -Cont HCTZ 25

## 2022-06-20 LAB — MICROALBUMIN / CREATININE URINE RATIO
Creatinine, Urine: 160.8 mg/dL
Microalb/Creat Ratio: 4 mg/g creat (ref 0–29)
Microalbumin, Urine: 6.6 ug/mL

## 2022-06-20 LAB — LIPID PANEL
Chol/HDL Ratio: 2.7 ratio (ref 0.0–4.4)
Cholesterol, Total: 191 mg/dL (ref 100–199)
HDL: 71 mg/dL (ref >39)
LDL Chol Calc (NIH): 91 mg/dL (ref 0–99)
Triglycerides: 169 mg/dL — ABNORMAL HIGH (ref 0–149)
VLDL Cholesterol Cal: 29 mg/dL (ref 5–40)

## 2022-06-30 ENCOUNTER — Other Ambulatory Visit: Payer: Self-pay | Admitting: Student

## 2022-06-30 DIAGNOSIS — E119 Type 2 diabetes mellitus without complications: Secondary | ICD-10-CM

## 2022-06-30 DIAGNOSIS — E1169 Type 2 diabetes mellitus with other specified complication: Secondary | ICD-10-CM

## 2022-07-21 ENCOUNTER — Encounter: Payer: Self-pay | Admitting: Gastroenterology

## 2022-07-22 ENCOUNTER — Other Ambulatory Visit: Payer: Self-pay | Admitting: Student

## 2022-07-22 DIAGNOSIS — E119 Type 2 diabetes mellitus without complications: Secondary | ICD-10-CM

## 2022-07-22 DIAGNOSIS — E1169 Type 2 diabetes mellitus with other specified complication: Secondary | ICD-10-CM

## 2022-08-04 ENCOUNTER — Ambulatory Visit (AMBULATORY_SURGERY_CENTER): Payer: 59

## 2022-08-04 VITALS — Ht 67.0 in | Wt 190.0 lb

## 2022-08-04 DIAGNOSIS — Z1211 Encounter for screening for malignant neoplasm of colon: Secondary | ICD-10-CM

## 2022-08-04 MED ORDER — ONDANSETRON HCL 4 MG PO TABS: 4.0000 mg | ORAL_TABLET | ORAL | 0 refills | Status: AC

## 2022-08-04 MED ORDER — NA SULFATE-K SULFATE-MG SULF 17.5-3.13-1.6 GM/177ML PO SOLN: 1.0000 | Freq: Once | ORAL | 0 refills | Status: AC

## 2022-08-04 NOTE — Progress Notes (Signed)

## 2022-08-13 ENCOUNTER — Telehealth: Payer: Self-pay | Admitting: Gastroenterology

## 2022-08-13 DIAGNOSIS — Z1211 Encounter for screening for malignant neoplasm of colon: Secondary | ICD-10-CM

## 2022-08-13 MED ORDER — PEG 3350-KCL-NA BICARB-NACL 420 G PO SOLR: 4000.0000 mL | Freq: Once | ORAL | 0 refills | Status: AC

## 2022-08-13 NOTE — Telephone Encounter (Signed)
New prescription for Golytely sent in to pharmacy. Attempted to call pt x 2 to go over new prep instruction. Unable to reach at this time and unable to leave VM. New instructions sent via my chart and mailing service. Due to procedure date being so close pt may not receive in the mail In time. If patient would like to put up prep instructions from the office we can reprint for her. If pt has questions hopefully she will return our call for further info,

## 2022-08-13 NOTE — Telephone Encounter (Signed)
Inbound call from patient is requesting a more affordable option to Suprep.

## 2022-08-18 ENCOUNTER — Telehealth: Payer: Self-pay | Admitting: Gastroenterology

## 2022-08-18 NOTE — Telephone Encounter (Signed)
Two attempts made to reach patient with no answer unable to leave a VM. Instructions have been resent via mychart. If unable to view instructions via mychart pt should be advised to pick up instructions from office her procedure is 2/29 she will not receive new instruction via mail in time.

## 2022-08-18 NOTE — Telephone Encounter (Signed)
Inbound call from pt , needs instruction   for SupPRE. Please advise

## 2022-08-21 ENCOUNTER — Ambulatory Visit (AMBULATORY_SURGERY_CENTER): Payer: 59 | Admitting: Gastroenterology

## 2022-08-21 ENCOUNTER — Encounter: Payer: Self-pay | Admitting: Gastroenterology

## 2022-08-21 VITALS — BP 111/70 | HR 61 | Temp 96.6°F | Resp 12 | Ht 67.0 in | Wt 190.0 lb

## 2022-08-21 DIAGNOSIS — D123 Benign neoplasm of transverse colon: Secondary | ICD-10-CM

## 2022-08-21 DIAGNOSIS — Z1211 Encounter for screening for malignant neoplasm of colon: Secondary | ICD-10-CM | POA: Diagnosis not present

## 2022-08-21 MED ORDER — SODIUM CHLORIDE 0.9 % IV SOLN: 500.0000 mL | INTRAVENOUS | Status: DC

## 2022-08-21 NOTE — Op Note (Signed)
[place] Patient Name: Tara Jarvis Procedure Date: 08/21/2022 9:38 AM MRN: [id number] Endoscopist: Nicki Reaper E. Candis Schatz , MD, [id number] Age: 62 Referring MD:  Date of Birth: 05/17/1961 Gender: Female Account #: [id number] Procedure:                Colonoscopy Indications:              Screening for colorectal malignant neoplasm, This                            is the patient's first colonoscopy Medicines:                Monitored Anesthesia Care Procedure:                Pre-Anesthesia Assessment:                           - Prior to the procedure, a History and Physical                            was performed, and patient medications and                            allergies were reviewed. The patient's tolerance of                            previous anesthesia was also reviewed. The risks                            and benefits of the procedure and the sedation                            options and risks were discussed with the patient.                            All questions were answered, and informed consent                            was obtained. Prior Anticoagulants: The patient has                            taken no anticoagulant or antiplatelet agents. ASA                            Grade Assessment: II - A patient with mild systemic                            disease. After reviewing the risks and benefits,                            the patient was deemed in satisfactory condition to                            undergo the procedure.  After obtaining informed consent, the colonoscope                            was passed under direct vision. Throughout the                            procedure, the patient's blood pressure, pulse, and                            oxygen saturations were monitored continuously. The                            Olympus CF-HQ190L ([id number]) Colonoscope was                            introduced through  the anus and advanced to the the                            terminal ileum, with identification of the                            appendiceal orifice and IC valve. The colonoscopy                            was performed without difficulty. The patient                            tolerated the procedure well. The quality of the                            bowel preparation was adequate. The terminal ileum,                            ileocecal valve, appendiceal orifice, and rectum                            were photographed. The bowel preparation used was                            SUPREP via split dose instruction. Scope In: 9:45:29 AM Scope Out: 10:00:27 AM Scope Withdrawal Time: 0 hours 11 minutes 19 seconds  Total Procedure Duration: 0 hours 14 minutes 58 seconds  Findings:                 The perianal and digital rectal examinations were                            normal. Pertinent negatives include normal                            sphincter tone and no palpable rectal lesions.                           A 3 mm polyp was found in the hepatic flexure. The  polyp was sessile. The polyp was removed with a                            cold snare. Resection and retrieval were complete.                            Estimated blood loss was minimal.                           A 5 mm polyp was found in the transverse colon. The                            polyp was sessile. The polyp was removed with a                            cold snare. Resection and retrieval were complete.                            Estimated blood loss was minimal.                           A few small-mouthed diverticula were found in the                            sigmoid colon.                           The exam was otherwise normal throughout the                            examined colon.                           The terminal ileum appeared normal.                           The retroflexed  view of the distal rectum and anal                            verge was normal and showed no anal or rectal                            abnormalities. Complications:            No immediate complications. Estimated Blood Loss:     Estimated blood loss was minimal. Impression:               - One 3 mm polyp at the hepatic flexure, removed                            with a cold snare. Resected and retrieved.                           - One 5 mm polyp in the transverse colon, removed  with a cold snare. Resected and retrieved.                           - Diverticulosis in the sigmoid colon.                           - The examined portion of the ileum was normal.                           - The distal rectum and anal verge are normal on                            retroflexion view. Recommendation:           - Patient has a contact number available for                            emergencies. The signs and symptoms of potential                            delayed complications were discussed with the                            patient. Return to normal activities tomorrow.                            Written discharge instructions were provided to the                            patient.                           - Resume previous diet.                           - Continue present medications.                           - Await pathology results.                           - Repeat colonoscopy (date not yet determined) for                            surveillance based on pathology results. Reznor Ferrando E. Candis Schatz, MD 08/21/2022 10:04:44 AM This report has been signed electronically.

## 2022-08-21 NOTE — Progress Notes (Signed)
Called to room to assist during endoscopic procedure.  Patient ID and intended procedure confirmed with present staff. Received instructions for my participation in the procedure from the performing physician.  

## 2022-08-21 NOTE — Patient Instructions (Addendum)
Resume previous diet. Continue present medications. Await pathology results. Repeat colonoscopy (date not yet determined) for surveillance based on pathology results.   YOU HAD AN ENDOSCOPIC PROCEDURE TODAY AT [facility]:   Refer to the procedure report that was given to you for any specific questions about what was found during the examination.  If the procedure report does not answer your questions, please call your gastroenterologist to clarify.  If you requested that your care partner not be given the details of your procedure findings, then the procedure report has been included in a sealed envelope for you to review at your convenience later.  YOU SHOULD EXPECT: Some feelings of bloating in the abdomen. Passage of more gas than usual.  Walking can help get rid of the air that was put into your GI tract during the procedure and reduce the bloating. If you had a lower endoscopy (such as a colonoscopy or flexible sigmoidoscopy) you may notice spotting of blood in your stool or on the toilet paper. If you underwent a bowel prep for your procedure, you may not have a normal bowel movement for a few days.  Please Note:  You might notice some irritation and congestion in your nose or some drainage.  This is from the oxygen used during your procedure.  There is no need for concern and it should clear up in a day or so.  SYMPTOMS TO REPORT IMMEDIATELY:  Following lower endoscopy (colonoscopy or flexible sigmoidoscopy):  Excessive amounts of blood in the stool  Significant tenderness or worsening of abdominal pains  Swelling of the abdomen that is new, acute  Fever of 100F or higher  For urgent or emergent issues, a gastroenterologist can be reached at any hour by calling [phone]. Do not use MyChart messaging for urgent concerns.    DIET:  We do recommend a small meal at first, but then you may proceed to your regular diet.  Drink plenty of fluids but you should avoid  alcoholic beverages for 24 hours.  ACTIVITY:  You should plan to take it easy for the rest of today and you should NOT DRIVE or use heavy machinery until tomorrow (because of the sedation medicines used during the test).    FOLLOW UP: Our staff will call the number listed on your records the next business day following your procedure.  We will call around 7:15- 8:00 am to check on you and address any questions or concerns that you may have regarding the information given to you following your procedure. If we do not reach you, we will leave a message.     If any biopsies were taken you will be contacted by phone or by letter within the next 1-3 weeks.  Please call us at [phone] if you have not heard about the biopsies in 3 weeks.    SIGNATURES/CONFIDENTIALITY: You and/or your care partner have signed paperwork which will be entered into your electronic medical record.  These signatures attest to the fact that that the information above on your After Visit Summary has been reviewed and is understood.  Full responsibility of the confidentiality of this discharge information lies with you and/or your care-partner.

## 2022-08-21 NOTE — Progress Notes (Signed)
[facility] History and Physical   Primary Care Physician:  Holley Bouche, MD   Reason for Procedure:   Colon cancer screening  Plan:    Screening colonoscopy     HPI: Tara Jarvis is a 62 y.o. female undergoing initial average risk screening colonoscopy.  She has no family history of colon cancer and no chronic GI symptoms.    Past Medical History:  Diagnosis Date   Arthritis    Diabetes mellitus without complication ([place])    Hyperlipidemia    Hypertension     Past Surgical History:  Procedure Laterality Date   BACK SURGERY  1998   Ruptured disc left lumbar    Prior to Admission medications   Medication Sig Start Date End Date Taking? Authorizing Provider  atorvastatin (LIPITOR) 40 MG tablet TAKE 1 TABLET (40 MG TOTAL) BY MOUTH DAILY. MAKE APPOINTMENT TO BE SEEN 07/22/22  Yes Sowell, Erlene Quan, MD  brimonidine-timolol (COMBIGAN) 0.2-0.5 % ophthalmic solution Place 1 drop into both eyes every 12 (twelve) hours.  10/08/17  Yes [provider]  empagliflozin (JARDIANCE) 25 MG TABS tablet Take 1 tablet (25 mg total) by mouth daily. 06/19/22  Yes Sowell, Erlene Quan, MD  hydrochlorothiazide (HYDRODIURIL) 25 MG tablet Take 1 tablet (25 mg total) by mouth daily. 06/19/22  Yes Sowell, Erlene Quan, MD  latanoprost (XALATAN) 0.005 % ophthalmic solution Place 1 drop into both eyes at bedtime. 05/21/17  Yes [provider]  metFORMIN (GLUCOPHAGE-XR) 500 MG 24 hr tablet Take 2 tablets (1,000 mg total) by mouth daily with breakfast. 06/19/22  Yes Sowell, Erlene Quan, MD  metoprolol succinate (TOPROL-XL) 25 MG 24 hr tablet TAKE 0.5 TABLETS (12.5 MG TOTAL) BY MOUTH DAILY. PLEASE MAKE APPOINTMENT WITH PCP 03/11/22  Yes Holley Bouche, MD  Alcohol Swabs (ALCOHOL PREP) 70 % PADS 1 each by Does not apply route once a week. Patient not taking: Reported on 08/04/2022 10/18/20   Mina Marble P, DO  Blood Glucose Monitoring Suppl (ONETOUCH VERIO) w/Device KIT 1 Device by  Does not apply route 2 (two) times daily. 12/04/21   Simmons-Robinson, Riki Sheer, MD  gabapentin (NEURONTIN) 300 MG capsule Take 1 capsule (300 mg total) by mouth 3 (three) times daily. Patient not taking: Reported on 08/04/2022 10/29/21   Jacqlyn Larsen, PA-C  glucose blood [facility] VERIO) test strip Use as instructed 12/04/21   Simmons-Robinson, Riki Sheer, MD  Lancet Devices (ONE TOUCH DELICA LANCING DEV) MISC 1 Device by Does not apply route 2 (two) times daily. 07/17/20   Mullis, Kiersten P, DO  methocarbamol (ROBAXIN) 500 MG tablet Take 1 tablet (500 mg total) by mouth 2 (two) times daily. Patient not taking: Reported on 08/04/2022 10/27/21   Eustaquio Maize, PA-C  nicotine polacrilex (NICORETTE) 4 MG gum Take 1 each (4 mg total) by mouth as needed for smoking cessation. Patient not taking: Reported on 08/04/2022 08/19/21   Holley Bouche, MD  ondansetron (ZOFRAN) 4 MG tablet Take 1 tablet (4 mg total) by mouth as directed. Take one Zofran 4 mg tablet 30-60 minutes before each colonoscopy prep dose Patient not taking: Reported on 08/21/2022 08/04/22   Daryel November, MD  OneTouch Delica Lancets [id number] MISC 1 Device by Does not apply route 2 (two) times daily. 12/04/21   Simmons-Robinson, Makiera, MD  TRULICITY [id number] [id number] SOPN Inject into the skin. 06/07/22   [provider]    Current Outpatient Medications  Medication Sig Dispense Refill   atorvastatin (LIPITOR) 40 MG tablet TAKE 1 TABLET (40 MG TOTAL) BY  MOUTH DAILY. MAKE APPOINTMENT TO BE SEEN 90 tablet 1   brimonidine-timolol (COMBIGAN) 0.2-0.5 % ophthalmic solution Place 1 drop into both eyes every 12 (twelve) hours.      empagliflozin (JARDIANCE) 25 MG TABS tablet Take 1 tablet (25 mg total) by mouth daily. 90 tablet 3   hydrochlorothiazide (HYDRODIURIL) 25 MG tablet Take 1 tablet (25 mg total) by mouth daily. 90 tablet 1   latanoprost (XALATAN) 0.005 % ophthalmic solution Place 1 drop into both eyes at bedtime.  5   metFORMIN  (GLUCOPHAGE-XR) 500 MG 24 hr tablet Take 2 tablets (1,000 mg total) by mouth daily with breakfast. 180 tablet 3   metoprolol succinate (TOPROL-XL) 25 MG 24 hr tablet TAKE 0.5 TABLETS (12.5 MG TOTAL) BY MOUTH DAILY. PLEASE MAKE APPOINTMENT WITH PCP 20 tablet 3   Alcohol Swabs (ALCOHOL PREP) 70 % PADS 1 each by Does not apply route once a week. (Patient not taking: Reported on 08/04/2022) 100 each 1   Blood Glucose Monitoring Suppl (ONETOUCH VERIO) w/Device KIT 1 Device by Does not apply route 2 (two) times daily. 1 kit 0   gabapentin (NEURONTIN) 300 MG capsule Take 1 capsule (300 mg total) by mouth 3 (three) times daily. (Patient not taking: Reported on 08/04/2022) 60 capsule 0   glucose blood (ONETOUCH VERIO) test strip Use as instructed 100 each 12   Lancet Devices (ONE TOUCH DELICA LANCING DEV) MISC 1 Device by Does not apply route 2 (two) times daily. 1 each 1   methocarbamol (ROBAXIN) 500 MG tablet Take 1 tablet (500 mg total) by mouth 2 (two) times daily. (Patient not taking: Reported on 08/04/2022) 20 tablet 0   nicotine polacrilex (NICORETTE) 4 MG gum Take 1 each (4 mg total) by mouth as needed for smoking cessation. (Patient not taking: Reported on 08/04/2022) 100 tablet 0   ondansetron (ZOFRAN) 4 MG tablet Take 1 tablet (4 mg total) by mouth as directed. Take one Zofran 4 mg tablet 30-60 minutes before each colonoscopy prep dose (Patient not taking: Reported on 08/21/2022) 2 tablet 0   OneTouch Delica Lancets [id number] MISC 1 Device by Does not apply route 2 (two) times daily. [id number] each 11   TRULICITY [id number] [id number] SOPN Inject into the skin.     Current Facility-Administered Medications  Medication Dose Route Frequency Provider Last Rate Last Admin   0.9 %  sodium chloride infusion  500 mL Intravenous Continuous Daryel November, MD        Allergies as of 08/21/2022 - Review Complete 08/21/2022  Allergen Reaction Noted   Lisinopril Anaphylaxis 08/28/2017    Family History  Problem Relation  Age of Onset   Diabetes Mother    Hyperlipidemia Mother    Hypertension Mother    Kidney disease Mother    Heart disease Father    Hypertension Sister    Hypertension Brother    Breast cancer Neg Hx    Colon cancer Neg Hx    Colon polyps Neg Hx    Esophageal cancer Neg Hx    Rectal cancer Neg Hx    Stomach cancer Neg Hx     Social History   Socioeconomic History   Marital status: Legally Separated    Spouse name: Not on file   Number of children: Not on file   Years of education: Not on file   Highest education level: Not on file  Occupational History   Not on file  Tobacco Use   Smoking status: Some Days  Packs/day: 0.20    Types: Cigarettes   Smokeless tobacco: Never  Vaping Use   Vaping Use: Never used  Substance and Sexual Activity   Alcohol use: Yes    Alcohol/week: 4.0 standard drinks of alcohol    Types: 4 Shots of liquor per week   Drug use: No   Sexual activity: Not on file  Other Topics Concern   Not on file  Social History Narrative   Lives in [place] with cousin Casilda Carls. Works for home health care.   Social Determinants of Health   Financial Resource Strain: Not on file  Food Insecurity: Not on file  Transportation Needs: Not on file  Physical Activity: Not on file  Stress: Not on file  Social Connections: Not on file  Intimate Partner Violence: Not on file    Review of Systems:  All other review of systems negative except as mentioned in the HPI.  Physical Exam: Vital signs BP (!) 146/84   Pulse 74   Temp (!) 96.6 F (35.9 C)   Ht '5\' 7"'$  (1.702 m)   Wt 190 lb (86.2 kg)   SpO2 97%   BMI 29.76 kg/m   General:   Alert,  Well-developed, well-nourished, pleasant and cooperative in NAD Airway:  Mallampati 2 Lungs:  Clear throughout to auscultation.   Heart:  Regular rate and rhythm; no murmurs, clicks, rubs,  or gallops. Abdomen:  Soft, nontender and nondistended. Normal bowel sounds.   Neuro/Psych:  Normal mood and  affect. A and O x 3   Kirstina Leinweber E. Candis Schatz, MD [facility]

## 2022-08-21 NOTE — Progress Notes (Signed)
Pt's states no medical or surgical changes since previsit or office visit. 

## 2022-08-21 NOTE — Progress Notes (Signed)
Vss nad trans to pacu °

## 2022-08-22 ENCOUNTER — Telehealth: Payer: Self-pay

## 2022-08-22 NOTE — Telephone Encounter (Signed)
  Follow up Call-     08/21/2022    8:53 AM  Call back number  Post procedure Call Back phone  # [phone]  Permission to leave phone message Yes     Patient questions:  Do you have a fever, pain , or abdominal swelling? No. Pain Score  0 *  Have you tolerated food without any problems? Yes.    Have you been able to return to your normal activities? Yes.    Do you have any questions about your discharge instructions: Diet   No. Medications  No. Follow up visit  No.  Do you have questions or concerns about your Care? No.  Actions: * If pain score is 4 or above: No action needed, pain <4.

## 2022-08-27 NOTE — Progress Notes (Signed)
Ms. Vicary,  The two polyps which I removed during your recent procedure were proven to be completely benign but are considered "pre-cancerous" polyps that MAY have grown into cancer if they had not been removed.  Studies shows that at least 20% of women over age 61 and 30% of men over age 57 have pre-cancerous polyps.  Based on current nationally recognized surveillance guidelines, I recommend that you have a repeat colonoscopy in 7 years.   If you develop any new rectal bleeding, abdominal pain or significant bowel habit changes, please contact me before then.

## 2022-09-26 ENCOUNTER — Telehealth: Payer: Self-pay | Admitting: Student

## 2022-09-26 NOTE — Telephone Encounter (Signed)
Called patient to schedule Medicare Annual Wellness Visit (AWV). No voicemail available to leave a message.  Last date of AWV: AWVI eligible as of 11/21/2016  Please schedule an AWVI appointment at any time with [facility] VISIT.  If any questions, please contact me at [phone].    Thank you,  [facility] Support [facility] Direct dial  [phone]

## 2022-09-30 ENCOUNTER — Encounter: Payer: Self-pay | Admitting: Student

## 2022-10-14 ENCOUNTER — Other Ambulatory Visit: Payer: Self-pay | Admitting: Student

## 2022-10-14 DIAGNOSIS — I10 Essential (primary) hypertension: Secondary | ICD-10-CM

## 2022-10-22 ENCOUNTER — Other Ambulatory Visit: Payer: Self-pay | Admitting: Student

## 2022-12-09 DIAGNOSIS — Z7984 Long term (current) use of oral hypoglycemic drugs: Secondary | ICD-10-CM | POA: Diagnosis not present

## 2022-12-09 DIAGNOSIS — Z7985 Long-term (current) use of injectable non-insulin antidiabetic drugs: Secondary | ICD-10-CM | POA: Diagnosis not present

## 2022-12-09 DIAGNOSIS — H401132 Primary open-angle glaucoma, bilateral, moderate stage: Secondary | ICD-10-CM | POA: Diagnosis not present

## 2022-12-09 DIAGNOSIS — H25813 Combined forms of age-related cataract, bilateral: Secondary | ICD-10-CM | POA: Diagnosis not present

## 2022-12-09 DIAGNOSIS — E1136 Type 2 diabetes mellitus with diabetic cataract: Secondary | ICD-10-CM | POA: Diagnosis not present

## 2023-01-05 DIAGNOSIS — F17210 Nicotine dependence, cigarettes, uncomplicated: Secondary | ICD-10-CM | POA: Diagnosis not present

## 2023-01-05 DIAGNOSIS — E785 Hyperlipidemia, unspecified: Secondary | ICD-10-CM | POA: Diagnosis not present

## 2023-01-05 DIAGNOSIS — I10 Essential (primary) hypertension: Secondary | ICD-10-CM | POA: Diagnosis not present

## 2023-01-05 DIAGNOSIS — Z0000 Encounter for general adult medical examination without abnormal findings: Secondary | ICD-10-CM | POA: Diagnosis not present

## 2023-01-05 DIAGNOSIS — E1169 Type 2 diabetes mellitus with other specified complication: Secondary | ICD-10-CM | POA: Diagnosis not present

## 2023-02-15 ENCOUNTER — Other Ambulatory Visit: Payer: Self-pay | Admitting: Student

## 2023-02-15 DIAGNOSIS — I10 Essential (primary) hypertension: Secondary | ICD-10-CM

## 2023-02-24 ENCOUNTER — Telehealth: Payer: Self-pay | Admitting: Student

## 2023-02-24 NOTE — Telephone Encounter (Signed)
Contacted Tara Jarvis to schedule their annual wellness visit. Patient declined to schedule AWV at this time.  I spoke to patient and she moved to [facility]. She has a new PCP, [facility].  Thank you,  [facility] Support [facility] Direct dial  [phone]

## 2023-07-14 ENCOUNTER — Other Ambulatory Visit: Payer: Self-pay | Admitting: Student

## 2023-07-14 DIAGNOSIS — E119 Type 2 diabetes mellitus without complications: Secondary | ICD-10-CM

## 2023-12-30 IMAGING — CT CT L SPINE W/O CM
3 series · 9 of 33 positions shown, 11 images · non-contrast
Comparison: None Available.

CLINICAL DATA: 60-year-old female with low back pain. Prior
surgery.



[Series 3: l-spine 2.0 st · axial · 0.28mm/px · z∈[-59,-59]mm · 1 of 137 slices shown, 2 images]
[im 74/137  soft-tissue]
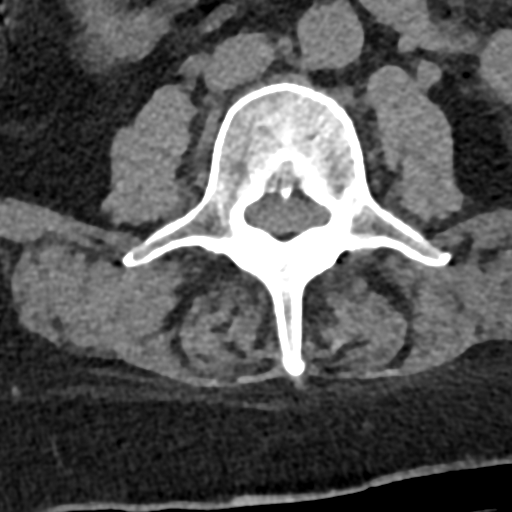
[im 74/137  bone]
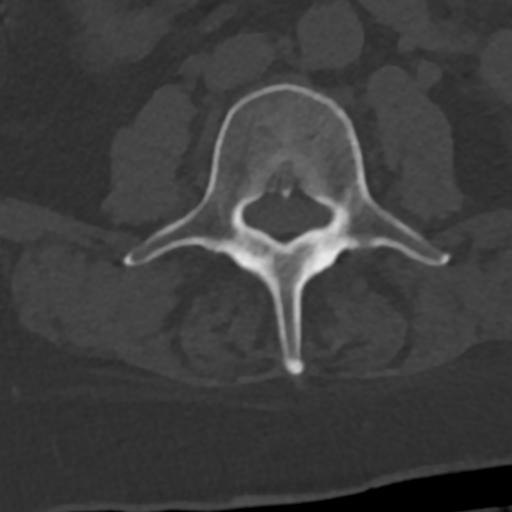

[Series 7: l-spine 2.0 sag · sagittal · 0.34mm/px · 5 of 61 slices shown, 6 images]
[im 21/61  bone]
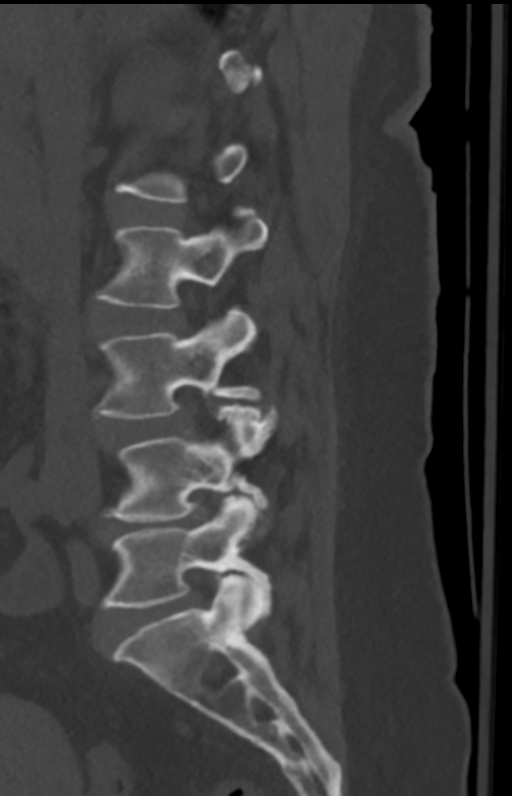
[im 26/61  bone]
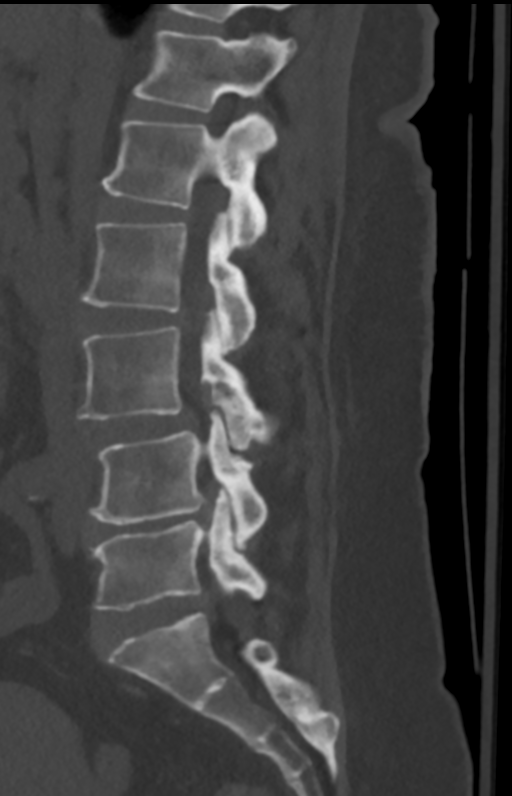
[im 31/61  soft-tissue]
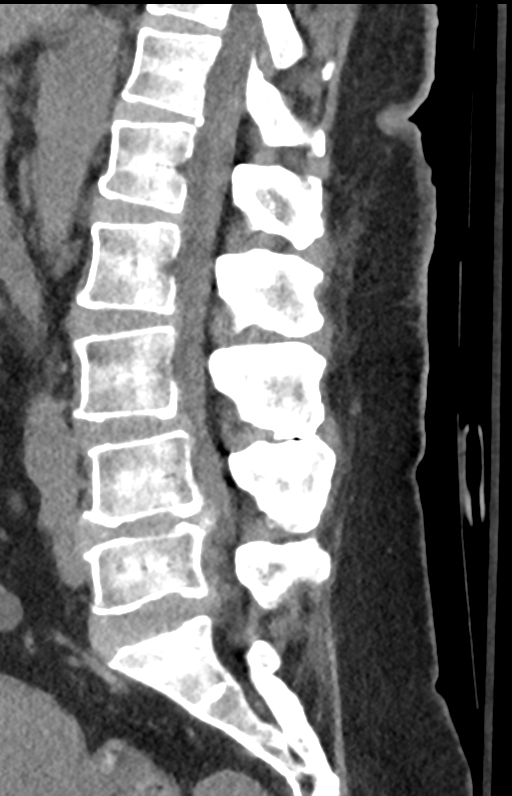
[im 31/61  bone]
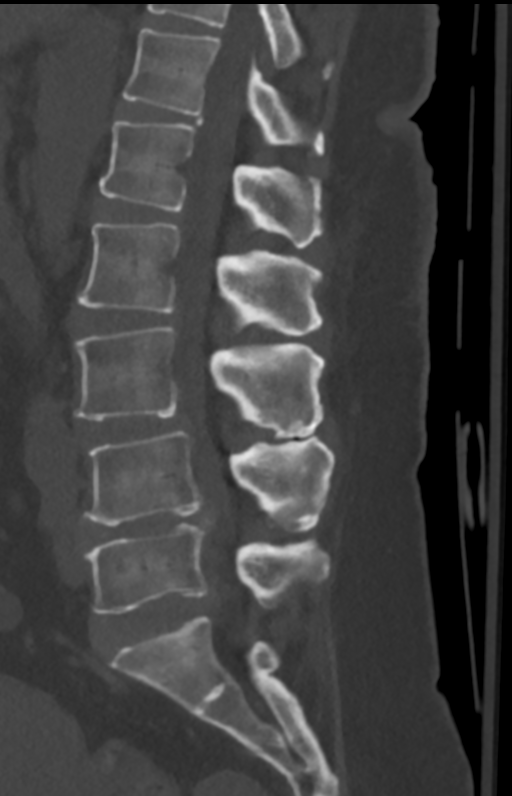
[im 36/61  bone]
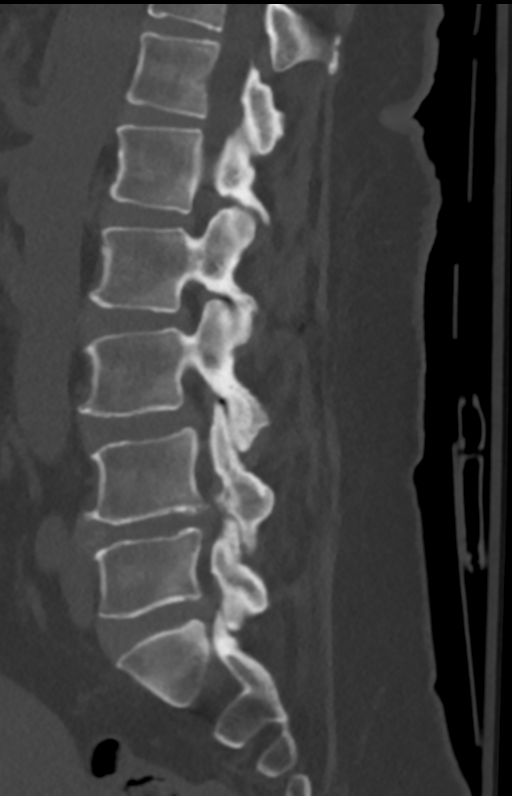
[im 41/61  bone]
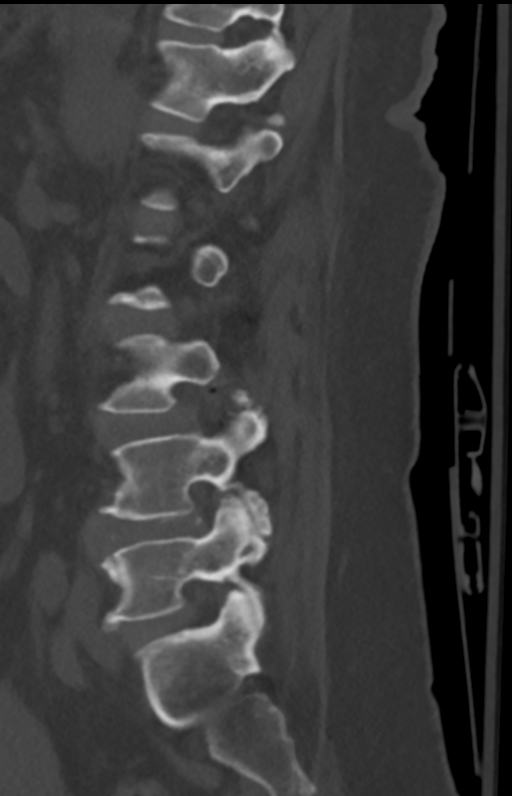

[Series 10: l-spine 2.0 cor · coronal · 0.25mm/px · 3 of 77 slices shown]
[im 16/77  bone]
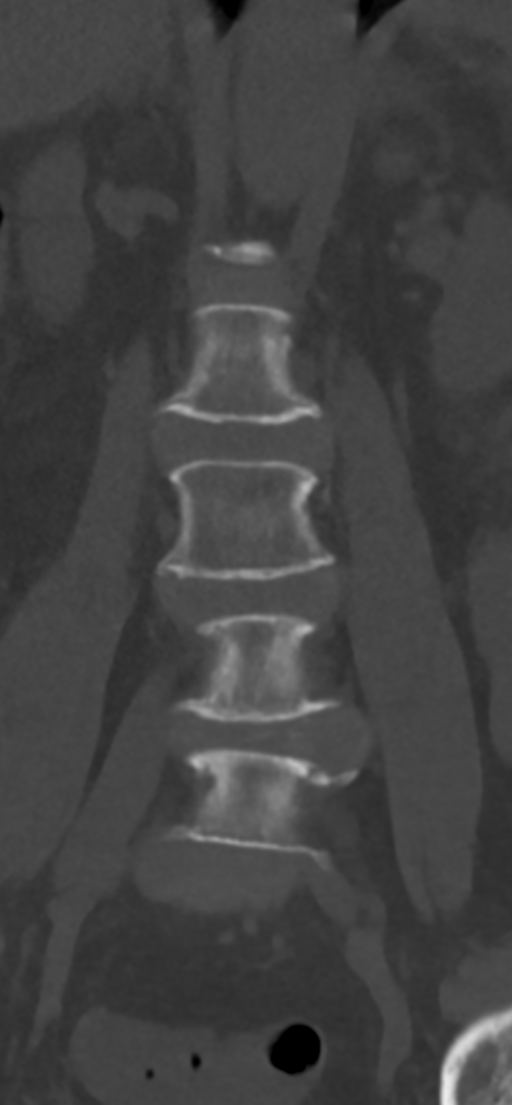
[im 31/77  bone]
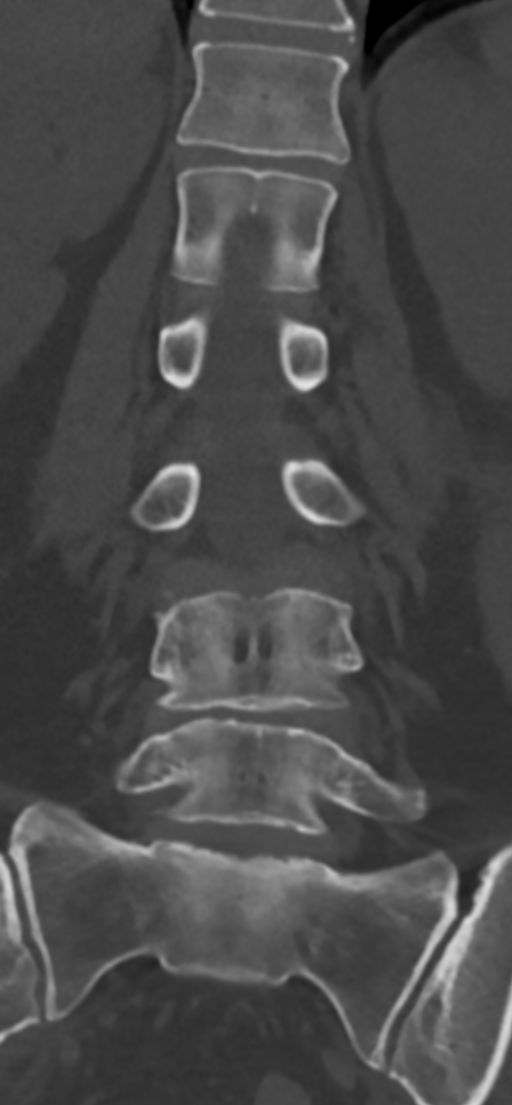
[im 46/77  bone]
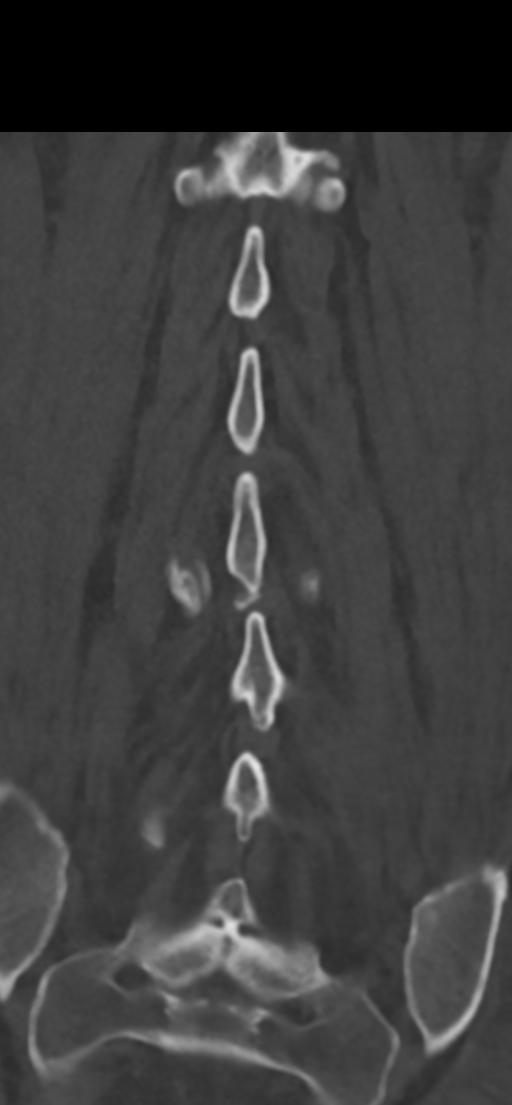

[9 of 33 positions shown; findings below may reference images not displayed]

FINDINGS: Segmentation: Normal.

Alignment: Mild dextroconvex upper and levoconvex lower lumbar
scoliosis with straightening of lumbar lordosis. Mild superimposed
grade 1 anterolisthesis of L3 on L4 (2-3 mm).

Vertebrae: Normal background bone mineralization. No acute osseous
abnormality identified. Visible sacrum and SI joints appear intact.

Paraspinal and other soft tissues: Minimal calcified atherosclerosis
at the aortoiliac bifurcation. Normal caliber abdominal aorta.
Negative visible other noncontrast abdominal viscera. Lumbar
paraspinal soft tissues are within normal limits.

Disc levels:

T11-T12: Minimal disc bulging and calcification. No stenosis.

T12-L1: Minimal posterior disc bulge and calcification. Mild facet
hypertrophy. No stenosis.

L1-L2:  Negative.

L2-L3: Mild circumferential disc bulge, facet and ligament flavum
hypertrophy. Mild spinal stenosis here in part related to epidural
lipomatosis (series 3, image 55).

L3-L4: Mild anterolisthesis. Mild to moderate circumferential
disc/pseudo disc. Moderate to severe facet and ligament flavum
hypertrophy. Subtle vacuum phenomena in the posterior right spinal
canal on series 3, image 73 might reflect synovial cyst. Combined
with epidural lipomatosis there is moderate or severe spinal
stenosis at this level. Mild to moderate left and mild right L3
foraminal stenosis.

L4-L5: Disc space loss with bulky circumferential disc osteophyte
complex. Broad-based posterior component. Moderate ligament flavum
and moderate to severe facet hypertrophy. Possible sub ligamentous
gas containing synovial cyst on the right series 3, image 89. Again,
moderate to severe spinal stenosis with bilateral lateral recess
stenosis (L5 nerve levels). Mild left but moderate to severe right
L4 foraminal stenosis.

L5-S1: Circumferential disc bulge and endplate spurring eccentric to
the left. Moderate facet hypertrophy greater on the right. No
significant spinal stenosis but mild to moderate lateral recess
stenosis greater on the left (left L5 nerve level). Mild left and
mild to moderate right L5 foraminal stenosis.
IMPRESSION: 1. No acute osseous abnormality in the lumbar spine. Mild lumbar
scoliosis with grade 1 anterolisthesis of L3 on L4.

2. Multifactorial moderate to severe spinal stenosis suspected at
both L3-L4 and L4-L5, with lateral recess stenosis at the L5 nerve
levels, and up to severe right L4 foraminal stenosis. Possible right
degenerative synovial cysts at both levels.

3. L5-S1 up to moderate stenosis at the left lateral recess and
right neural foramen.

## 2024-01-01 IMAGING — DX DG HAND COMPLETE 3+V*R*
1 series · 3 of 3 positions shown · non-contrast
Comparison: None Available.

CLINICAL DATA: Evaluate dislocation of second metacarpal phalangeal
joint.

EXAM:
RIGHT HAND - COMPLETE 3+ VIEW

[Series 1: hand · 0.14mm/px · 3 of 3 slices shown]
[im 1/3]
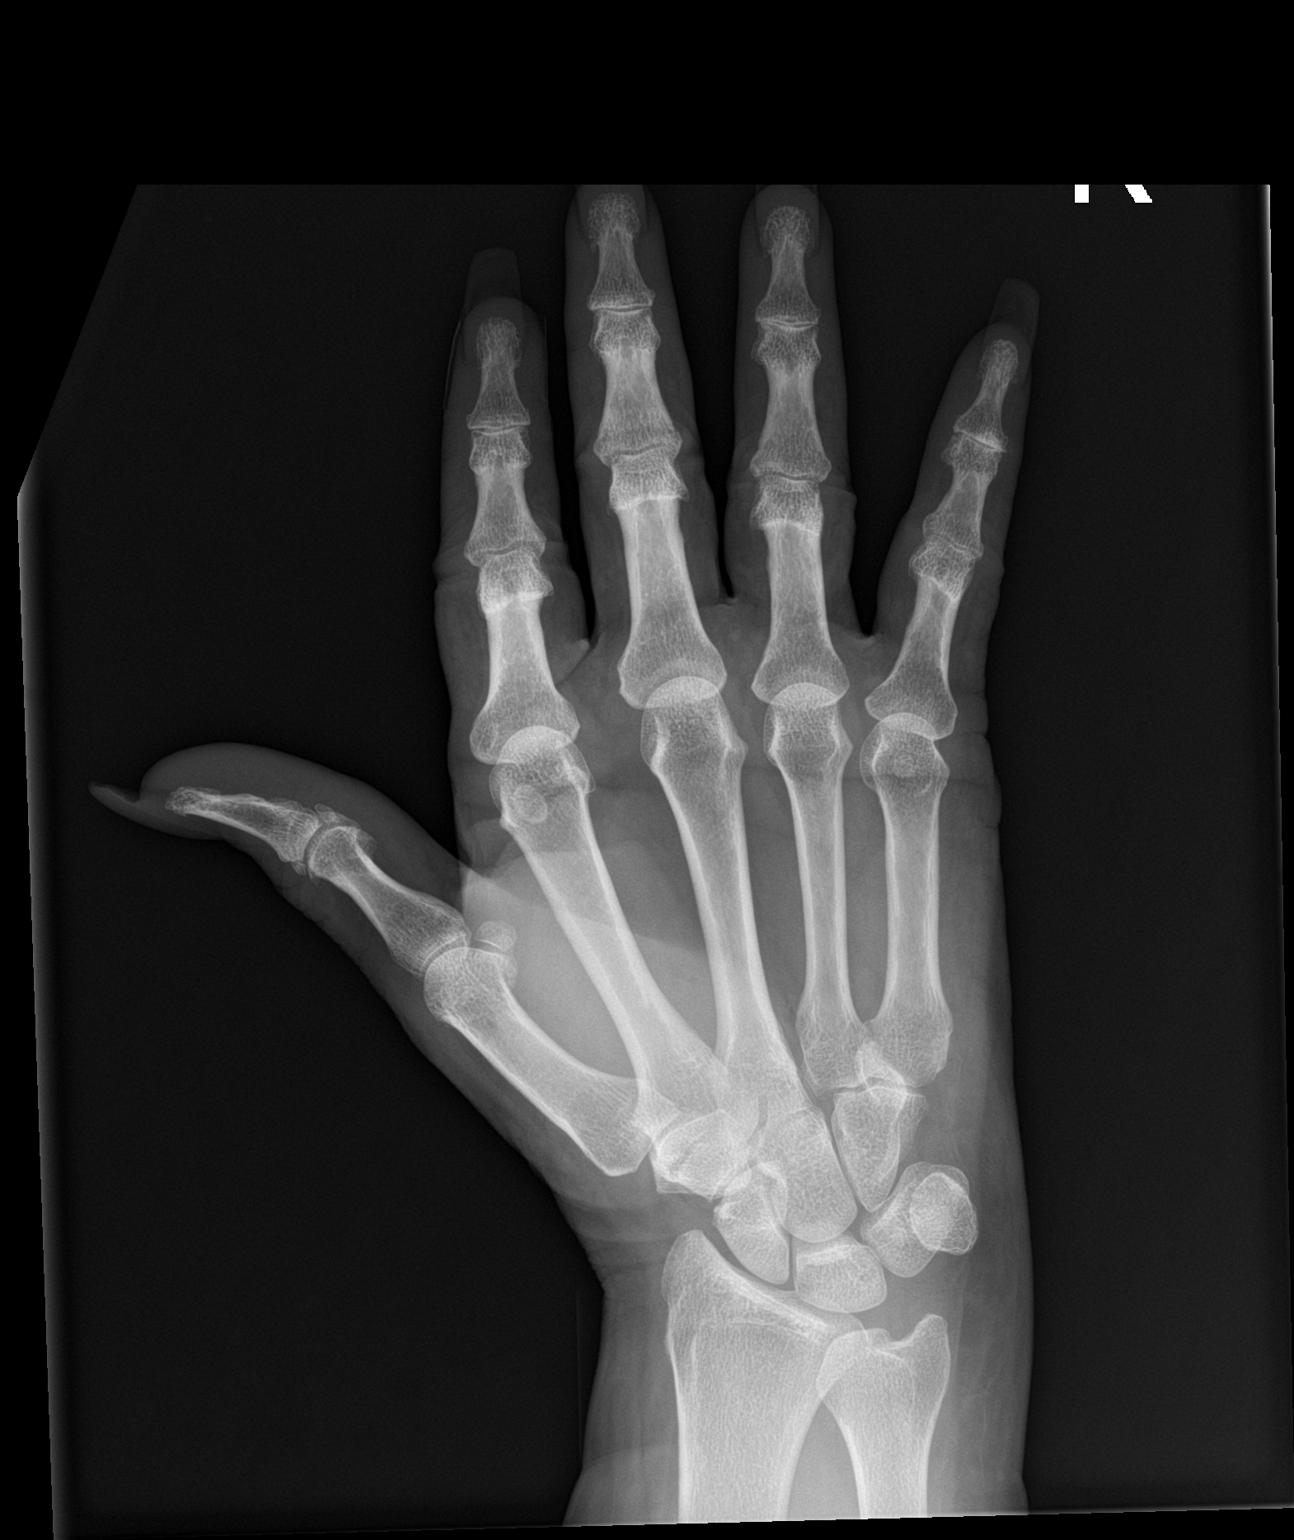
[im 2/3]
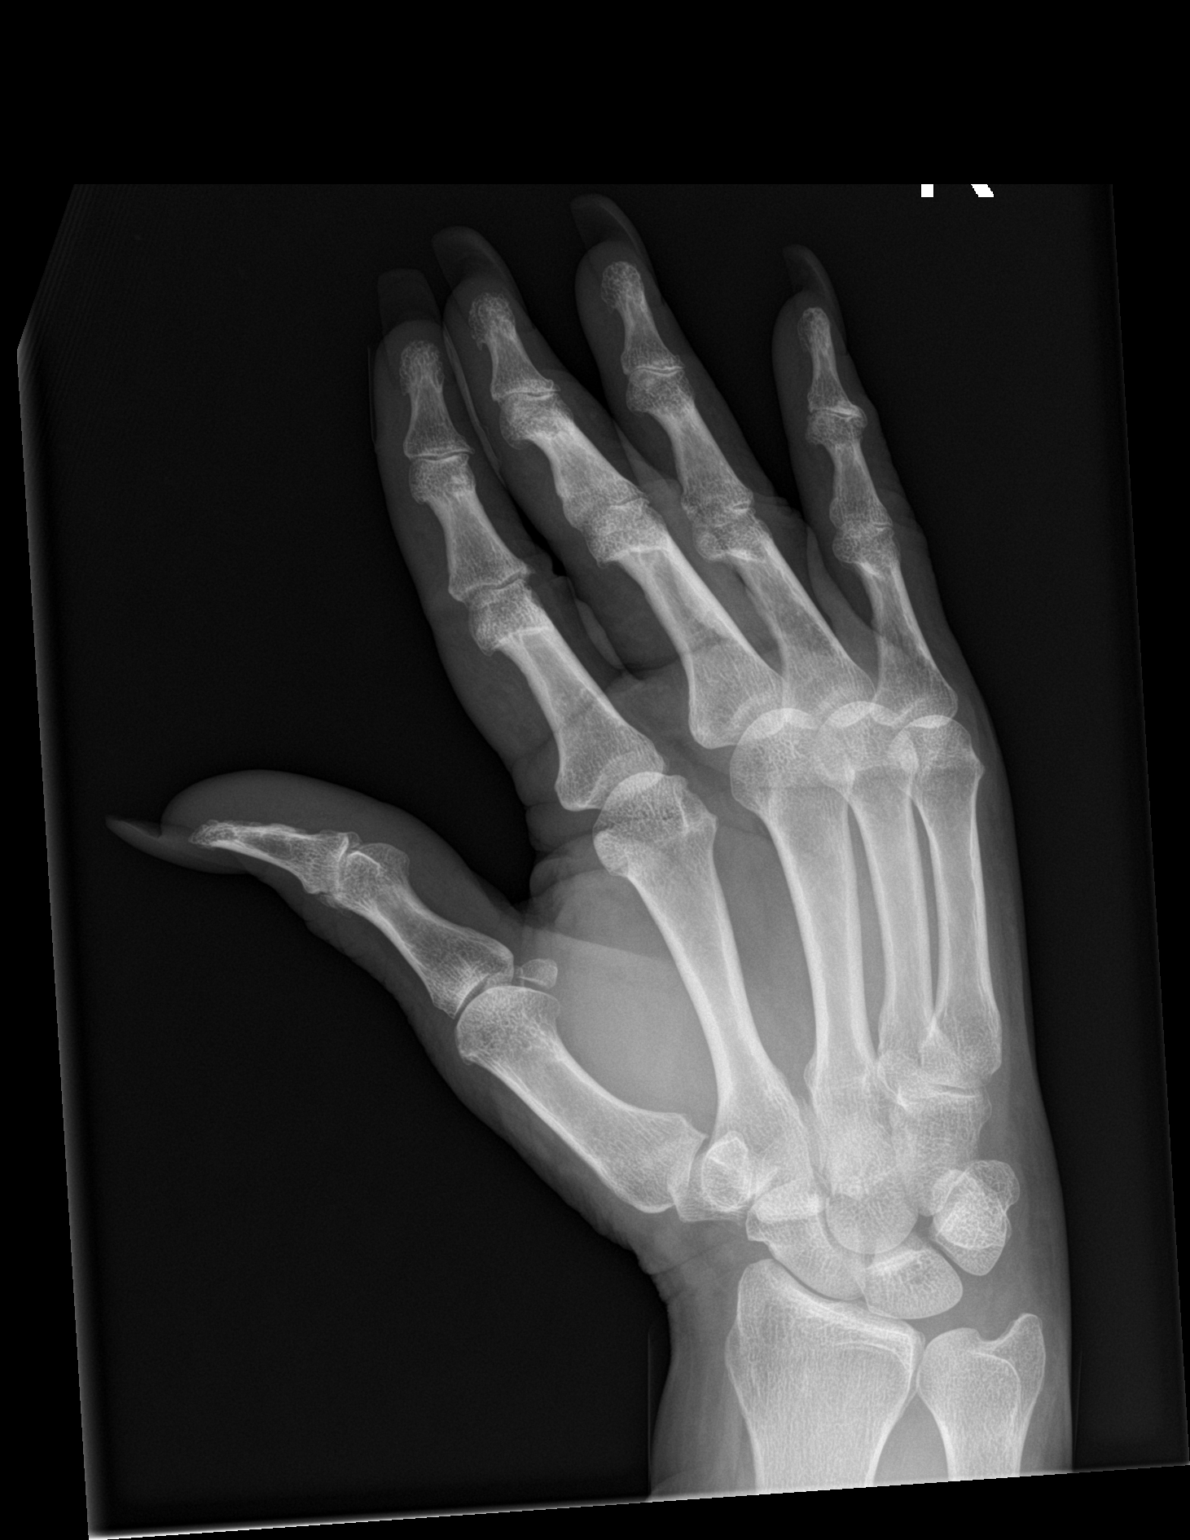
[im 3/3]
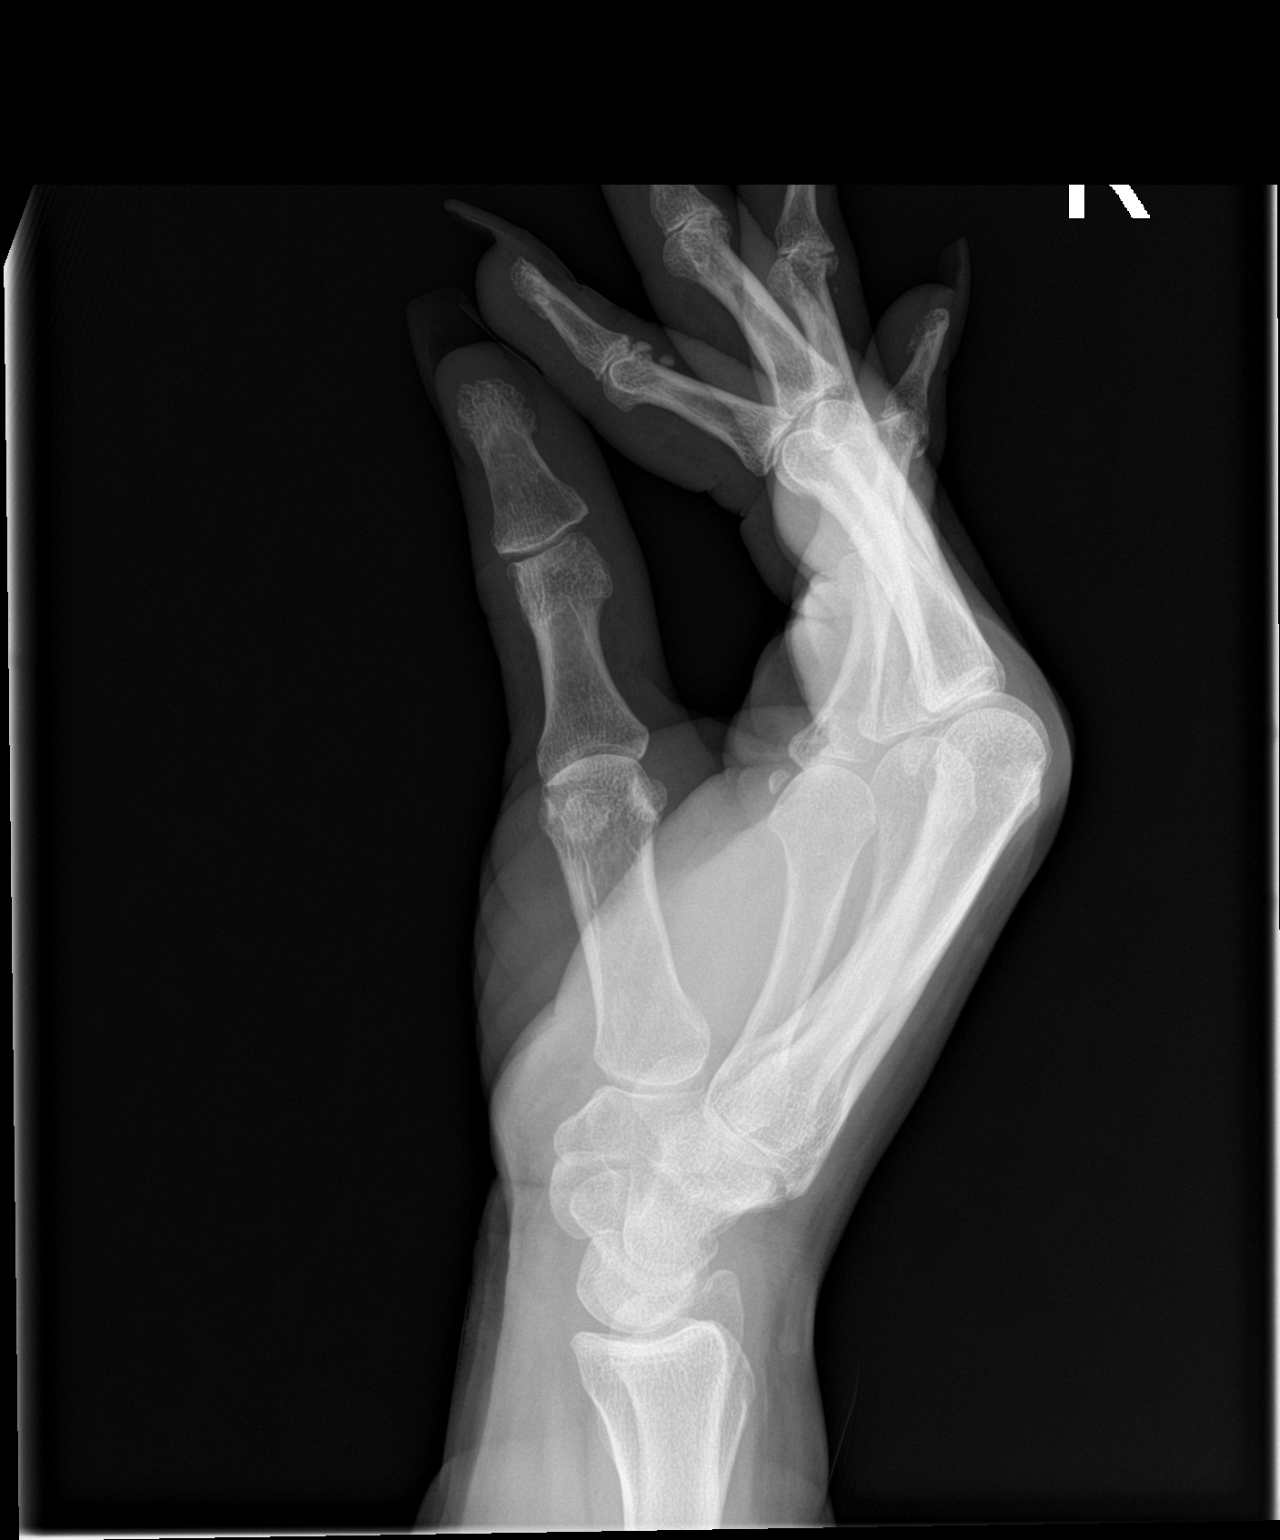

[3 of 3 positions shown; findings below may reference images not displayed]

FINDINGS: No signs of acute fracture or dislocation. Mild degenerative changes
are noted involving the second through fifth D IP joints. The soft
tissues are unremarkable.
IMPRESSION: 1. No acute findings.
2. Mild osteoarthritis of the DIP joints.
